# Patient Record
Sex: Female | Born: 1980 | Race: White | Hispanic: No | Marital: Single | Smoking: Current every day smoker
Health system: Southern US, Community
[De-identification: ages and names within clinical notes are randomized; demographics above are authoritative.]

## PROBLEM LIST (undated history)

## (undated) DIAGNOSIS — F1010 Alcohol abuse, uncomplicated: Secondary | ICD-10-CM

## (undated) DIAGNOSIS — F329 Major depressive disorder, single episode, unspecified: Secondary | ICD-10-CM

## (undated) DIAGNOSIS — F419 Anxiety disorder, unspecified: Secondary | ICD-10-CM

## (undated) DIAGNOSIS — F32A Depression, unspecified: Secondary | ICD-10-CM

---

## 2012-05-15 ENCOUNTER — Encounter (HOSPITAL_COMMUNITY): Payer: Self-pay | Admitting: Emergency Medicine

## 2012-05-15 ENCOUNTER — Emergency Department (HOSPITAL_COMMUNITY)
Admission: EM | Admit: 2012-05-15 | Discharge: 2012-05-15 | Disposition: A | Discharge status: Home / Self Care | Payer: Self-pay | Attending: Emergency Medicine | Admitting: Emergency Medicine

## 2012-05-15 DIAGNOSIS — R000 Tachycardia, unspecified: Secondary | ICD-10-CM | POA: Insufficient documentation

## 2012-05-15 DIAGNOSIS — T1490XA Injury, unspecified, initial encounter: Secondary | ICD-10-CM | POA: Insufficient documentation

## 2012-05-15 DIAGNOSIS — F10929 Alcohol use, unspecified with intoxication, unspecified: Secondary | ICD-10-CM

## 2012-05-15 DIAGNOSIS — F1010 Alcohol abuse, uncomplicated: Secondary | ICD-10-CM | POA: Insufficient documentation

## 2012-05-15 DIAGNOSIS — IMO0002 Reserved for concepts with insufficient information to code with codable children: Secondary | ICD-10-CM | POA: Insufficient documentation

## 2012-05-15 LAB — POCT I-STAT, CHEM 8
BUN: 13 mg/dL (ref 6–23)
Calcium, Ion: 1.01 mmol/L — ABNORMAL LOW (ref 1.12–1.32)
Chloride: 107 mEq/L (ref 96–112)
Creatinine, Ser: 1.3 mg/dL — ABNORMAL HIGH (ref 0.50–1.10)
Glucose, Bld: 56 mg/dL — ABNORMAL LOW (ref 70–99)
TCO2: 20 mmol/L (ref 0–100)

## 2012-05-15 LAB — POCT PREGNANCY, URINE: Preg Test, Ur: NEGATIVE

## 2012-05-15 MED ORDER — SODIUM CHLORIDE 0.9 % IV BOLUS (SEPSIS)
1000.0000 mL | Freq: Once | INTRAVENOUS | Status: AC
  Administered 2012-05-15: 1000 mL via INTRAVENOUS
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
## 2012-05-15 NOTE — ED Notes (Signed)
[employer] at bedside to speak with pt.  
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
## 2012-05-19 NOTE — ED Provider Notes (Signed)
  I performed a history and physical examination of Christine Dickson and discussed her management with Dr. Lew Dawes.  I agree with the history, physical, assessment, and plan of care, with the following exceptions: None  I was present for the following procedures: None Time Spent in Critical Care of the patient: None Time spent in discussions with the patient and family: 10  Emmaline Wahba Corlis Leak, MD 05/19/12 [phone]

## 2012-07-15 ENCOUNTER — Emergency Department (HOSPITAL_COMMUNITY)
Admission: EM | Admit: 2012-07-15 | Discharge: 2012-07-16 | Disposition: A | Discharge status: Other Acute Inpatient Hospital | Payer: Self-pay | Attending: Emergency Medicine | Admitting: Emergency Medicine

## 2012-07-15 ENCOUNTER — Encounter (HOSPITAL_COMMUNITY): Payer: Self-pay | Admitting: Adult Health

## 2012-07-15 DIAGNOSIS — F411 Generalized anxiety disorder: Secondary | ICD-10-CM | POA: Insufficient documentation

## 2012-07-15 DIAGNOSIS — F1020 Alcohol dependence, uncomplicated: Secondary | ICD-10-CM | POA: Insufficient documentation

## 2012-07-15 DIAGNOSIS — F10929 Alcohol use, unspecified with intoxication, unspecified: Secondary | ICD-10-CM

## 2012-07-15 DIAGNOSIS — F3289 Other specified depressive episodes: Secondary | ICD-10-CM | POA: Insufficient documentation

## 2012-07-15 DIAGNOSIS — F1010 Alcohol abuse, uncomplicated: Secondary | ICD-10-CM | POA: Insufficient documentation

## 2012-07-15 DIAGNOSIS — F329 Major depressive disorder, single episode, unspecified: Secondary | ICD-10-CM | POA: Insufficient documentation

## 2012-07-15 HISTORY — DX: Major depressive disorder, single episode, unspecified: F32.9

## 2012-07-15 HISTORY — DX: Depression, unspecified: F32.A

## 2012-07-15 LAB — COMPREHENSIVE METABOLIC PANEL
AST: 31 U/L (ref 0–37)
Albumin: 3.9 g/dL (ref 3.5–5.2)
Alkaline Phosphatase: 55 U/L (ref 39–117)
Alkaline Phosphatase: 50 U/L (ref 39–117)
BUN: 13 mg/dL (ref 6–23)
CO2: 24 mEq/L (ref 19–32)
Chloride: 105 mEq/L (ref 96–112)
Creatinine, Ser: 0.68 mg/dL (ref 0.50–1.10)
GFR calc Af Amer: >90 mL/min (ref >90)
Glucose, Bld: 97 mg/dL (ref 70–99)
Potassium: 4.3 mEq/L (ref 3.5–5.1)
Potassium: 4.2 mEq/L (ref 3.5–5.1)
Total Bilirubin: 0.1 mg/dL — ABNORMAL LOW (ref 0.3–1.2)
Total Protein: 7.3 g/dL (ref 6.0–8.3)

## 2012-07-15 LAB — CBC WITH DIFFERENTIAL/PLATELET
Basophils Absolute: 0.2 10*3/uL — ABNORMAL HIGH (ref 0.0–0.1)
Eosinophils Absolute: 0.1 10*3/uL (ref 0.0–0.7)
HCT: 37.7 % (ref 36.0–46.0)
Lymphs Abs: 2.2 10*3/uL (ref 0.7–4.0)
MCH: 33.1 pg (ref 26.0–34.0)
MCHC: 34.7 g/dL (ref 30.0–36.0)
MCV: 95.2 fL (ref 78.0–100.0)
Monocytes Absolute: 0.8 10*3/uL (ref 0.1–1.0)
Neutro Abs: 4.6 10*3/uL (ref 1.7–7.7)
RDW: 12.5 % (ref 11.5–15.5)

## 2012-07-15 LAB — RAPID URINE DRUG SCREEN, HOSP PERFORMED
Barbiturates: NOT DETECTED
Cocaine: NOT DETECTED

## 2012-07-15 LAB — ACETAMINOPHEN LEVEL: Acetaminophen (Tylenol), Serum: <15 ug/mL (ref 10–30)

## 2012-07-15 LAB — POCT PREGNANCY, URINE: Preg Test, Ur: NEGATIVE

## 2012-07-15 LAB — URINALYSIS, ROUTINE W REFLEX MICROSCOPIC
Bilirubin Urine: NEGATIVE
Hgb urine dipstick: NEGATIVE
Protein, ur: NEGATIVE mg/dL
Specific Gravity, Urine: 1.02 (ref 1.005–1.030)
Urobilinogen, UA: 0.2 mg/dL (ref 0.0–1.0)

## 2012-07-15 LAB — CBC
HCT: 39.2 % (ref 36.0–46.0)
Hemoglobin: 13.6 g/dL (ref 12.0–15.0)
MCH: 33.1 pg (ref 26.0–34.0)
MCHC: 34.7 g/dL (ref 30.0–36.0)
MCV: 95.4 fL (ref 78.0–100.0)
RDW: 12.5 % (ref 11.5–15.5)

## 2012-07-15 LAB — URINE MICROSCOPIC-ADD ON

## 2012-07-15 LAB — ETHANOL: Alcohol, Ethyl (B): 459 mg/dL (ref 0–11)

## 2012-07-15 LAB — MAGNESIUM: Magnesium: 2 mg/dL (ref 1.5–2.5)

## 2012-07-15 MED ORDER — FOLIC ACID 1 MG PO TABS
1.0000 mg | ORAL_TABLET | Freq: Every day | ORAL | Status: DC
  Administered 2012-07-15: 1 mg via ORAL
  Filled 2012-07-15: qty 1

## 2012-07-15 MED ORDER — SODIUM CHLORIDE 0.9 % IV BOLUS (SEPSIS)
2000.0000 mL | Freq: Once | INTRAVENOUS | Status: AC
  Administered 2012-07-15: 1000 mL via INTRAVENOUS

## 2012-07-15 MED ORDER — FAMOTIDINE IN NACL 20-0.9 MG/50ML-% IV SOLN
20.0000 mg | Freq: Once | INTRAVENOUS | Status: AC
  Administered 2012-07-15: 20 mg via INTRAVENOUS
  Filled 2012-07-15: qty 50

## 2012-07-15 MED ORDER — VITAMIN B-1 100 MG PO TABS
50.0000 mg | ORAL_TABLET | Freq: Once | ORAL | Status: AC
  Administered 2012-07-15: 50 mg via ORAL
  Filled 2012-07-15: qty 2

## 2012-07-15 MED ORDER — ONDANSETRON 4 MG PO TBDP
4.0000 mg | ORAL_TABLET | Freq: Once | ORAL | Status: AC
  Administered 2012-07-15: 4 mg via ORAL
  Filled 2012-07-15: qty 1

## 2012-07-15 MED ORDER — LORAZEPAM 2 MG/ML IJ SOLN: 1.0000 mg | Freq: Four times a day (QID) | INTRAMUSCULAR | Status: DC | PRN

## 2012-07-15 MED ORDER — LORAZEPAM 1 MG PO TABS
1.0000 mg | ORAL_TABLET | Freq: Four times a day (QID) | ORAL | Status: DC | PRN
  Administered 2012-07-15 – 2012-07-16 (×3): 1 mg via ORAL
  Filled 2012-07-15 (×3): qty 1

## 2012-07-15 MED ORDER — ADULT MULTIVITAMIN W/MINERALS CH
1.0000 | ORAL_TABLET | Freq: Every day | ORAL | Status: DC
  Administered 2012-07-15: 1 via ORAL
  Filled 2012-07-15: qty 1

## 2012-07-15 NOTE — ED Notes (Signed)
Report to Sunita RN 

## 2012-07-15 NOTE — ED Notes (Signed)
IV ,18g removed from rt [facility] as it was tender.

## 2012-07-15 NOTE — ED Provider Notes (Signed)
History     CSN: [id number]  Arrival date & time 07/15/12  0330   First MD Initiated Contact with Patient 07/15/12 [phone]      Chief Complaint  Patient presents with  . Medical Clearance    (Consider location/radiation/quality/duration/timing/severity/associated sxs/prior treatment) HPI  This patient is a 31 year old woman who denies any significant past medical history. She presents with request for treatment of alcoholism. The patient is inconsistent in her history regarding the amount and frequency with which she drinks alcohol. She drinks vodka only. A sounds like she is mainly a binge drinker. She only drinks alone. She has had two DUIs.  She has also lost two jobs in the past 6 months.  Her BF says she is very emotionally labile and frequently angry.   Tonight, she had uncontrollable crying and nausea with heaving after her BF returned home from work around 2300 last night. Patient has entered tx for alcoholism once before - in 2001 - but signed herself out.  She denies history of DTs or withdrawal seizure. She says her last drink was before midnight and that she had "a few glasses".   She is not taking any meds and does not see a PCP nor psychiatrist. Denies SI and HI.  BF has no concerns that the patient is suicidal or psychotic. Patient denies abdominal pain.   Past Medical History  Diagnosis Date  . Depression     History reviewed. No pertinent past surgical history.  History reviewed. No pertinent family history.  History  Substance Use Topics  . Smoking status: Current Everyday Smoker  . Smokeless tobacco: Not on file  . Alcohol Use: Yes    OB History    Grav Para Term Preterm Abortions TAB SAB Ect Mult Living                  Review of Systems    It should be noted that ROS was obtained while the patient was intoxicated.   Gen: no weight loss, fevers, chills, night sweats Eyes: no discharge or drainage, no occular pain or visual changes Nose: no epistaxis  or rhinorrhea Mouth: no dental pain, no sore throat Neck: no neck pain Lungs: no SOB, cough, wheezing CV: no chest pain, palpitations, dependent edema or orthopnea Abd: as per hpi, otherwise negative GU: no dysuria or gross hematuria MSK: no myalgias or arthralgias Neuro: no headache, no focal neurologic deficits Skin: no rash Psyche: depression, alcohol abuse  Allergies  Review of patient's allergies indicates no known allergies.  Home Medications  No current outpatient prescriptions on file.  BP 126/84  Pulse 99  Temp 98.9 F (37.2 C)  Resp 18  SpO2 94%  Physical Exam  Gen: well developed and well nourished appearing, appears intoxicated, intermittently tearful, alert and oriented x 4.  Head: NCAT Eyes: PERL, EOMI, conjunctiva mildly injected bilaterally. Nose: no epistaixis or rhinorrhea Mouth/throat: mucosa is moist and pink Neck: supple, no stridor Lungs: CTA B, no wheezing, rhonchi or rales CV: RRR, no murmur, extremities appear well perfused Abd: soft, notender, nondistended Back: no ttp, no cva ttp Skin: no rash Neuro: CN ii-xii grossly intact, mildly slurred speech, no motor deficits.  Psyche; tearful affect with anxiety, limited insight, appears intoxicated, cooperative  ED Course  Procedures (including critical care time)  Labs Reviewed  COMPREHENSIVE METABOLIC PANEL - Abnormal; Notable for the following:    Total Bilirubin 0.1 (*)     All other components within normal limits  ETHANOL - Abnormal;  Notable for the following:    Alcohol, Ethyl (B) 459 (*)     All other components within normal limits  COMPREHENSIVE METABOLIC PANEL - Abnormal; Notable for the following:    Total Bilirubin 0.1 (*)     All other components within normal limits  CBC  ACETAMINOPHEN LEVEL  URINE RAPID DRUG SCREEN (HOSP PERFORMED)  POCT PREGNANCY, URINE  LIPASE, BLOOD  CBC WITH DIFFERENTIAL  URINALYSIS, ROUTINE W REFLEX MICROSCOPIC  PREGNANCY, URINE    ED COURSE AND  MDM:  Patient with acute alcohol intoxication - BAL 450 mg/dL - and chronic alcoholism. She is requesting inpatient tx for alcoholism and I think this would be a good step for her. Her BAL will need to clear to around 200 mg/dL before she may be adequately assessed by our mental health team. In the meantime, we have treated with thiamine and will give oral folate and MTV. Mg level is pending. We will begin alcohol withdrawal protocol. Tx with IVF.    MDM          Brandt Loosen, MD 07/15/12 [phone]
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
## 2012-07-15 NOTE — ED Notes (Addendum)
Family member reports psychological stress, anxiety, alcohol intoxication and uncontrollable crying. Pt is tearful and very anxious and complains of lower right quandrant pain, vomiting, and diarrhea. Pt recently had a DUI and has been "unrecognizable" ever since.  Smells of ETOH, denies other substance abuse, denies SI and HI

## 2012-07-15 NOTE — ED Notes (Signed)
Act Team member notified of patient being placed in Pod C and evaluation would be needed.

## 2012-07-15 NOTE — ED Notes (Addendum)
Per boyfriend. Came home this morning and she was acting irrationally. Complaining of abdominal pain localized to right side.  When asked to show me where it hurts pt touchs right hip, and right flank.  Witness vomiting. Was unable to get pt off the floor so he called EMS. Pt has a history of depression and ETOH abuse.  Reports temps over 100 F over the past few days.  Pt. Unable to state what she drank.

## 2012-07-16 ENCOUNTER — Inpatient Hospital Stay (HOSPITAL_COMMUNITY)
Admission: AD | Admit: 2012-07-16 | Discharge: 2012-07-18 | DRG: 897 | Disposition: A | Discharge status: Home / Self Care | Payer: No Typology Code available for payment source | Attending: Psychiatry | Admitting: Psychiatry

## 2012-07-16 ENCOUNTER — Encounter (HOSPITAL_COMMUNITY): Payer: Self-pay | Admitting: Emergency Medicine

## 2012-07-16 DIAGNOSIS — F10939 Alcohol use, unspecified with withdrawal, unspecified: Principal | ICD-10-CM | POA: Diagnosis present

## 2012-07-16 DIAGNOSIS — F1020 Alcohol dependence, uncomplicated: Secondary | ICD-10-CM | POA: Diagnosis present

## 2012-07-16 DIAGNOSIS — F1994 Other psychoactive substance use, unspecified with psychoactive substance-induced mood disorder: Secondary | ICD-10-CM | POA: Diagnosis present

## 2012-07-16 DIAGNOSIS — F10239 Alcohol dependence with withdrawal, unspecified: Principal | ICD-10-CM | POA: Diagnosis present

## 2012-07-16 MED ORDER — MAGNESIUM HYDROXIDE 400 MG/5ML PO SUSP: 30.0000 mL | Freq: Every day | ORAL | Status: DC | PRN

## 2012-07-16 MED ORDER — CHLORDIAZEPOXIDE HCL 25 MG PO CAPS: 25.0000 mg | ORAL_CAPSULE | Freq: Four times a day (QID) | ORAL | Status: DC | PRN

## 2012-07-16 MED ORDER — CHLORDIAZEPOXIDE HCL 25 MG PO CAPS
25.0000 mg | ORAL_CAPSULE | Freq: Three times a day (TID) | ORAL | Status: AC
  Administered 2012-07-17 (×3): 25 mg via ORAL
  Filled 2012-07-16 (×3): qty 1

## 2012-07-16 MED ORDER — VITAMIN B-1 100 MG PO TABS
100.0000 mg | ORAL_TABLET | Freq: Every day | ORAL | Status: DC
  Filled 2012-07-16 (×2): qty 1

## 2012-07-16 MED ORDER — ACETAMINOPHEN 325 MG PO TABS
650.0000 mg | ORAL_TABLET | Freq: Four times a day (QID) | ORAL | Status: DC | PRN
  Administered 2012-07-16 – 2012-07-18 (×2): 650 mg via ORAL

## 2012-07-16 MED ORDER — LOPERAMIDE HCL 2 MG PO CAPS: 2.0000 mg | ORAL_CAPSULE | ORAL | Status: DC | PRN

## 2012-07-16 MED ORDER — ONDANSETRON 4 MG PO TBDP: 4.0000 mg | ORAL_TABLET | Freq: Four times a day (QID) | ORAL | Status: DC | PRN

## 2012-07-16 MED ORDER — THIAMINE HCL 100 MG/ML IJ SOLN: 100.0000 mg | Freq: Once | INTRAMUSCULAR | Status: DC

## 2012-07-16 MED ORDER — HYDROXYZINE HCL 25 MG PO TABS: 25.0000 mg | ORAL_TABLET | Freq: Four times a day (QID) | ORAL | Status: DC | PRN

## 2012-07-16 MED ORDER — ALUM & MAG HYDROXIDE-SIMETH 200-200-20 MG/5ML PO SUSP: 30.0000 mL | ORAL | Status: DC | PRN

## 2012-07-16 MED ORDER — SULFAMETHOXAZOLE-TMP DS 800-160 MG PO TABS
1.0000 | ORAL_TABLET | Freq: Two times a day (BID) | ORAL | Status: DC
  Administered 2012-07-16 – 2012-07-18 (×5): 1 via ORAL
  Filled 2012-07-16: qty 11
  Filled 2012-07-16 (×8): qty 1
  Filled 2012-07-16: qty 11

## 2012-07-16 MED ORDER — VITAMIN B-1 100 MG PO TABS
100.0000 mg | ORAL_TABLET | Freq: Every day | ORAL | Status: DC
  Administered 2012-07-17 – 2012-07-18 (×2): 100 mg via ORAL
  Filled 2012-07-16 (×4): qty 1

## 2012-07-16 MED ORDER — CHLORDIAZEPOXIDE HCL 25 MG PO CAPS
25.0000 mg | ORAL_CAPSULE | ORAL | Status: DC
  Administered 2012-07-18: 25 mg via ORAL
  Filled 2012-07-16: qty 1

## 2012-07-16 MED ORDER — ADULT MULTIVITAMIN W/MINERALS CH
1.0000 | ORAL_TABLET | Freq: Every day | ORAL | Status: DC
  Filled 2012-07-16 (×3): qty 1

## 2012-07-16 MED ORDER — NICOTINE 14 MG/24HR TD PT24
14.0000 mg | MEDICATED_PATCH | Freq: Every day | TRANSDERMAL | Status: DC
  Administered 2012-07-16 – 2012-07-18 (×3): 14 mg via TRANSDERMAL
  Filled 2012-07-16 (×6): qty 1

## 2012-07-16 MED ORDER — CHLORDIAZEPOXIDE HCL 25 MG PO CAPS
25.0000 mg | ORAL_CAPSULE | Freq: Four times a day (QID) | ORAL | Status: AC
  Administered 2012-07-16 (×3): 25 mg via ORAL
  Filled 2012-07-16 (×3): qty 1

## 2012-07-16 MED ORDER — ADULT MULTIVITAMIN W/MINERALS CH
1.0000 | ORAL_TABLET | Freq: Every day | ORAL | Status: DC
Start: 2012-07-16 — End: 2012-07-18
  Administered 2012-07-16 – 2012-07-18 (×3): 1 via ORAL
  Filled 2012-07-16 (×5): qty 1

## 2012-07-16 MED ORDER — CITALOPRAM HYDROBROMIDE 20 MG PO TABS
20.0000 mg | ORAL_TABLET | Freq: Every day | ORAL | Status: DC
  Administered 2012-07-16 – 2012-07-18 (×3): 20 mg via ORAL
  Filled 2012-07-16 (×4): qty 1
  Filled 2012-07-16: qty 14
  Filled 2012-07-16: qty 1

## 2012-07-16 MED ORDER — THIAMINE HCL 100 MG/ML IJ SOLN
100.0000 mg | Freq: Once | INTRAMUSCULAR | Status: AC
  Administered 2012-07-16: 100 mg via INTRAMUSCULAR

## 2012-07-16 MED ORDER — TRAZODONE HCL 100 MG PO TABS
100.0000 mg | ORAL_TABLET | Freq: Every evening | ORAL | Status: DC | PRN
  Administered 2012-07-16 – 2012-07-17 (×2): 100 mg via ORAL
  Filled 2012-07-16 (×2): qty 1
  Filled 2012-07-16: qty 14

## 2012-07-16 MED ORDER — CHLORDIAZEPOXIDE HCL 25 MG PO CAPS: 25.0000 mg | ORAL_CAPSULE | Freq: Every day | ORAL | Status: DC

## 2012-07-16 MED ORDER — PANTOPRAZOLE SODIUM 20 MG PO TBEC
20.0000 mg | DELAYED_RELEASE_TABLET | Freq: Two times a day (BID) | ORAL | Status: DC
  Administered 2012-07-16 – 2012-07-18 (×5): 20 mg via ORAL
  Filled 2012-07-16 (×3): qty 1
  Filled 2012-07-16: qty 28
  Filled 2012-07-16 (×2): qty 1
  Filled 2012-07-16: qty 28
  Filled 2012-07-16 (×4): qty 1

## 2012-07-16 MED ORDER — CHLORDIAZEPOXIDE HCL 25 MG PO CAPS
50.0000 mg | ORAL_CAPSULE | Freq: Once | ORAL | Status: AC
  Administered 2012-07-16: 50 mg via ORAL
  Filled 2012-07-16: qty 2

## 2012-07-16 MED ORDER — CITALOPRAM HYDROBROMIDE 10 MG PO TABS
10.0000 mg | ORAL_TABLET | Freq: Every day | ORAL | Status: DC
  Filled 2012-07-16 (×2): qty 1

## 2012-07-16 MED ORDER — NICOTINE 14 MG/24HR TD PT24
MEDICATED_PATCH | TRANSDERMAL | Status: AC
  Filled 2012-07-16: qty 1

## 2012-07-16 NOTE — BHH Counselor (Signed)
Christine Dickson, ACT counselor at [facility], submitted Pt for admission to [facility]. Consulted with Binnie Rail, [facility] who confirmed bed space. Gave clinical report to Verne Spurr, PA who accepted Pt to the service of Dr. Thomasene Lot, room 304-2.  Harlin Rain Patsy Baltimore, LPC

## 2012-07-16 NOTE — Progress Notes (Signed)
D.  Pt pleasant and bright on approach.  No s/s of withdrawal present at this time.  Denies SI/HI/hallucinations at this time.  Positive for evening AA group.  Denies complaints other than difficulty sleeping.  Interacting  Appropriately within milieu.  A.  Support and encouragement given, will give medication for insomnia at HS   R.  Pt in dayroom interacting with peers, will continue to monitor.

## 2012-07-16 NOTE — Progress Notes (Signed)
Patient ID: Christine Dickson, female   DOB: 07-22-1981, 31 y.o.   MRN: [id number] She has been up and to meal and to get her medication. She has been pleasant and cooperative. Self inventory Depression 4, hopeless 3, . W/D of chilling stomach pain and problem sleeping. Cwia is 2 at this time.

## 2012-07-16 NOTE — ED Notes (Signed)
Pt excepted in [facility].

## 2012-07-16 NOTE — Progress Notes (Signed)
Patient ID: Christine Dickson, female   DOB: 12-Nov-1981, 31 y.o.   MRN: [id number]  Pt admitted Voluntarily for Alcohol detox. Pt states she drank 1 pint of liquor on Friday night. Pt states she averages drinking two nights per week. Pt is not employed and lives in a private residence with her boyfriend. Pt denies SI or plans to hurt herself at this time. Pt oriented to unit rules and regulations.

## 2012-07-16 NOTE — BHH Suicide Risk Assessment (Signed)
Suicide Risk Assessment  Admission Assessment     Demographic factors:  Assessment Details Time of Assessment: Admission Information Obtained From: Patient Current Mental Status:    Loss Factors:    Historical Factors:    Risk Reduction Factors:  Risk Reduction Factors: Sense of responsibility to family;Positive social support  CLINICAL FACTORS:   Severe Anxiety and/or Agitation Depression:   Anhedonia Comorbid alcohol abuse/dependence Insomnia Alcohol/Substance Abuse/Dependencies More than one psychiatric diagnosis Previous Psychiatric Diagnoses and Treatments  COGNITIVE FEATURES THAT CONTRIBUTE TO RISK:  None Noted.   Current Mental Status Per Physician:   Diagnosis: Axis I:  Alcohol Dependence.  Substance Induced Mood Disorder.  The patient was seen today and reports the following:   ADL's: Intact.  Sleep: The patient reports to having difficulty initiating and maintaining sleep.  Appetite: The patient reports a good appetite today.   Mild>(1-10) >Severe  Hopelessness (1-10): 0  Depression (1-10): 3  Anxiety (1-10): 6   Suicidal Ideation: The patient denies any suicidal ideations today.  Plan: No  Intent: No  Means: No   Homicidal Ideation: The patient denies any homicidal ideations today.  Plan: No  Intent: No.  Means: No   General Appearance/Behavior: The patient was cooperative today with this provider but appeared moderately depressed.  Eye Contact: Good.  Speech: Appropriate in rate and volume with no pressuring of speech noted today.  Motor Behavior: wnl.  Level of Consciousness: Alert and Oriented x 3.  Mental Status: Alert and Oriented x 3.  Mood:  Moderately Depressed.  Affect: Moderately Constricted.  Anxiety Level: Moderate anxiety reported today.  Thought Process: wnl.  Thought Content: The patient denies any auditory or visual hallucinations or delusional thinking.  Perception: wnl.  Judgment: Fair to Good.  Insight: Fair to Good.    Cognition: Oriented to person, place and time.   Current Medications:  . chlordiazePOXIDE  25 mg Oral QID   Followed by  . chlordiazePOXIDE  25 mg Oral TID   Followed by  . chlordiazePOXIDE  25 mg Oral BH-qamhs   Followed by  . chlordiazePOXIDE  25 mg Oral Daily  . citalopram  20 mg Oral Daily  . multivitamin with minerals  1 tablet Oral Daily  . pantoprazole  20 mg Oral BID AC  . sulfamethoxazole-trimethoprim  1 tablet Oral BID WC  . thiamine  100 mg Intramuscular Once  . thiamine  100 mg Intramuscular Once  . thiamine  100 mg Oral Daily   Review of Systems:  Neurological: No headaches, seizures or dizziness reported.  G.I.: The patient denies any constipation or stomach upset today.  Musculoskeletal: The patient denies any musculoskeletal issues today.   Time was spent today discussing with the patient her current symptoms. The patient states that she is having significant difficulty initiating and maintaining sleep but reports a good appetite. She reports moderate feelings of sadness, anhedonia and depressed mood and denies any suicidal or homicidal ideations. The patient also denies any auditory or visual hallucinations or delusional thinking but reports moderate anxiety symptoms today.   The patient states that she has been using alcohol for some time and had a blood alcohol level at admission of 459.  She also reports depression and irritability and presents today for alcohol detox and for treatment of her depressive and anxiety symptoms.  Treatment Plan Summary:  1. Daily contact with patient to assess and evaluate symptoms and progress in treatment.  2. Medication management  3. The patient will deny suicidal ideations or  homicidal ideations for 48 hours prior to discharge and have a depression and anxiety rating of 3 or less. The patient will also deny any auditory or visual hallucinations or delusional thinking.  4. The patient will deny any symptoms of substance withdrawal  at time of discharge.   Plan:  1. Will start the patient on the medication Celexa at 20 mgs po q am for depression and anxiety.  2. Will start the patient on the Librium Protocol for detox from her addiction to alcohol.  3. Will start the patient on the medication Bactrim DS po BID-WC x 7 days for a UTI. 4. Will start the patient in the medication Trazodone at 100 mgs po qhs - prn for sleep. 5. Laboratory Studies reviewed.  6. Will continue to monitor.   SUICIDE RISK:  Minimal: No identifiable suicidal ideation.  Patients presenting with no risk factors but with morbid ruminations; may be classified as minimal risk based on the severity of the depressive symptoms  Christine Dickson 07/16/2012, 1:39 PM

## 2012-07-16 NOTE — Tx Team (Signed)
Initial Interdisciplinary Treatment Plan  PATIENT STRENGTHS: (choose at least two) Ability for insight Average or above average intelligence Capable of independent living Communication skills General fund of knowledge Motivation for treatment/growth Physical Health Supportive family/friends  PATIENT STRESSORS: Substance abuse   PROBLEM LIST: Problem List/Patient Goals Date to be addressed Date deferred Reason deferred Estimated date of resolution  Alcohol Abuse 07/16/12     Depression 07/16/12                                                DISCHARGE CRITERIA:  Ability to meet basic life and health needs Improved stabilization in mood, thinking, and/or behavior Motivation to continue treatment in a less acute level of care Verbal commitment to aftercare and medication compliance  PRELIMINARY DISCHARGE PLAN: Outpatient therapy Return to previous living arrangement  PATIENT/FAMIILY INVOLVEMENT: This treatment plan has been presented to and reviewed with the patient, Christine Dickson, and/or family member, .  The patient and family have been given the opportunity to ask questions and make suggestions.  Christine Dickson 07/16/2012, 5:08 AM

## 2012-07-16 NOTE — Progress Notes (Signed)
BHH Group Notes:  (Counselor/Nursing/MHT/Case Management/Adjunct)  07/16/2012 1:15 PM  Type of Therapy:  Group Therapy, Dance/Movement Therapy   Participation Level:  Active  Participation Quality:  Appropriate, Sharing and Supportive  Affect:  Appropriate  Cognitive:  Appropriate  Insight:  Limited  Engagement in Group:  Good  Engagement in Therapy:  Limited  Modes of Intervention:  Clarification, Problem-solving, Role-play, Socialization and Support  Summary of Progress/Problems: Group discussed how to support your self and how to be selfish in a postive way, being selfish wanting your recovery. Group shared how to use emopwering personal statements to stay positive and focused. Pt shared that her grandfather was her main support for he taught her many life lessons. Pt shared a phrase from her grandfather about doing everything to the fullest and finishing what you started.

## 2012-07-16 NOTE — H&P (Signed)
Psychiatric Admission Assessment Adult  Patient Identification:  Christine Dickson Date of Evaluation:  07/16/2012 31yo SWF CC:  Alcohol detox ETOH 459  History of Present Illness: BF brought to [facility] for help. After he returned home from work around 11 pm patient was crying uncontrollably heaving and c/o nausea. She is frequently angry and can be labile.  She is a binge drinker Vodka only. 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 Denies withdrawal seizures or DT's. CIWA 7.   Past Psychiatric History: Entered treatment for alcoholism 2001 but signed herself out.   Substance Abuse History: Began using alcohol age 31. Usually consumes a pint of Vodka 2-3 times a week.   Social History:    reports that she has been smoking.  She does not have any smokeless tobacco history on file. She reports that she drinks alcohol. She reports that she does not use illicit drugs. Some college never married no children. Today is one year anniversary with this BF. 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 First DUI 8 years ago in [facility].   Family Psych History: Maternal grandfather alcohol.   Past Medical History:     Past Medical History  Diagnosis Date  . Depression       No past surgical history on file.  Allergies: No Known Allergies  Current Medications:  Prior to Admission medications   Not on File    Mental Status Examination/Evaluation: Objective:  Appearance: Casual  Psychomotor Activity:  Normal and Tremor is minimal   Eye Contact:  Good  Speech:  Clear and Coherent and Normal Rate  Volume:  Normal  Mood:  Easily angered   Affect:  Depressed  Thought Process:  Clear rational goal oriented   Orientation:  Full  Thought Content:  No AVH/psychosis   Suicidal Thoughts:  No  Homicidal Thoughts:  No  Judgement:  Fair  Insight:  Fair    DIAGNOSIS:    AXIS I Alcohol Abuse, Substance Abuse and Substance Induced Mood Disorder  AXIS II  Deferred  AXIS III See medical history.  AXIS IV economic problems, occupational problems and other psychosocial or environmental problems  AXIS V 41-50 serious symptoms     Treatment Plan Summary: Admit for safety & stabilization  Medically support through alcohol detox using the Librium protocol.  Start Celexa for lack of motivation and anger

## 2012-07-16 NOTE — Progress Notes (Signed)
Patient did attend the evening speaker AA meeting.  

## 2012-07-17 NOTE — [facility] Counselor (Signed)
Adult Comprehensive Assessment  Patient ID: Christine Dickson, female   DOB: May 03, 1981, 31 y.o.   MRN: [id number]  Information Source: Information source: Patient  Current Stressors:  Educational / Learning stressors: no stressors reported Employment / Job issues: lost job a month or so ago Family Relationships: no stressors reported Surveyor, quantity / Lack of resources (include bankruptcy): no income or insurance, just began receiving unemployment Housing / Lack of housing: no stressors reported Physical health (include injuries & life threatening diseases): no stressors reported Social relationships: no stressors reported Substance abuse: alcohol abuse (binge) Bereavement / Loss: loss of job, loss of sense of self  Living/Environment/Situation:  Living Arrangements: Spouse/significant other Living conditions (as described by patient or guardian): lives with boyfriend How long has patient lived in current situation?: 1 year What is atmosphere in current home: Supportive;Loving  Family History:  Marital status: Long term relationship Long term relationship, how long?: 1 year  What types of issues is patient dealing with in the relationship?: bf is very supportive and is worried about her drinking Does patient have children?: No  Childhood History:  By whom was/is the patient raised?: Both parents Additional childhood history information: "normal, stable childhood" Description of patient's relationship with caregiver when they were a child: good with mother and father Patient's description of current relationship with people who raised him/her: good with mother and father Does patient have siblings?: Yes Number of Siblings: 2  Description of patient's current relationship with siblings: sisters, she is close to both of them Did patient suffer any verbal/emotional/physical/sexual abuse as a child?: No Did patient suffer from severe childhood neglect?: No Has patient ever been sexually  abused/assaulted/raped as an adolescent or adult?: No Was the patient ever a victim of a crime or a disaster?: No Witnessed domestic violence?: No Has patient been effected by domestic violence as an adult?: No  Education:  Highest grade of school patient has completed: some college Currently a Consulting civil engineer?: No Learning disability?: No  Employment/Work Situation:   Employment situation: Unemployed (hasn't worked in a month) Patient's job has been impacted by current illness: Yes Describe how patient's job has been implacted: last job was lost due to depresion and stress level What is the longest time patient has a held a job?: 7 years 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 Has patient ever been in the [employer]?: No Has patient ever served in combat?: No  Financial Resources:   Financial resources: Actor unemployment Does patient have a Lawyer or guardian?: No  Alcohol/Substance Abuse:   What has been your use of drugs/alcohol within the last 12 months?: has begun using alcohol lately, a couple of times a week and drinking a lot (maybe a pint?) when she does drink; has been using the alcohol to reduce depression and anxiety If attempted suicide, did drugs/alcohol play a role in this?: No Alcohol/Substance Abuse Treatment Hx: Denies past history Has alcohol/substance abuse ever caused legal problems?: Yes (DUI at the beginning of July)  Social Support System:   Patient's Community Support System: Production assistant, radio System: boyfriend, parents, sisters Type of faith/religion: Catholic How does patient's faith help to cope with current illness?: prayer, talking to God  Leisure/Recreation:   Leisure and Hobbies: playing tennis, swimming, reading, crossword puzzles  Strengths/Needs:   What things does the patient do well?: good listener, people person, likes  being around people In what areas does patient struggle / problems for  patient: had an extreme amount to drink and  bf brought him in, unemployed with no insurance, some depression, DUI in July, legal stuff, bottles things in instead of talking  Discharge Plan:   Does patient have access to transportation?: Yes Will patient be returning to same living situation after discharge?: Yes Currently receiving community mental health services: No If no, would patient like referral for services when discharged?: Yes (What county?) Medical sales representative) Does patient have financial barriers related to discharge medications?: Yes Patient description of barriers related to discharge medications: has no insurance  Summary/Recommendations:   Summary and Recommendations (to be completed by the evaluator): Christine Dickson is a 31 year old single female diagnosed with Alcohol Dependence. She seems to minimize the severity of her drinking problem, stating that she does not drink often but drinks a lot when she does drink. According to assessment in chart she is drinking 2-3 times a week. Boyfriend is supportive and wants her to get sober. She denies any trauma or major stressors, but does state she is unemployed. Also reports she got a DUI in July and worries about that legal matter. States she drinks when she is feeling stresed about these things. Christine Dickson would benefit from crisi sstabilization, medication evauation , therapy groups for processing thoughts/feelings/experiences, psychoed  groups for coping skills and case management for discharge planning.   Lyn Hollingshead, Lyndee Hensen. 07/17/2012

## 2012-07-17 NOTE — Progress Notes (Signed)
[facility] MD Progress Note  07/17/2012 3:30 PM  S/O: Patient seen and evaluated. Chart reviewed. Patient stated that her mood was "good". Her affect was mood congruent and euthymic. She denied any current thoughts of self injurious behavior, suicidal ideation or homicidal ideation. There were no auditory or visual hallucinations, paranoia, delusional thought processes, or mania noted.  Thought process was linear and goal directed.  No psychomotor agitation or retardation was noted. Speech was normal rate, tone and volume. Eye contact was good. Judgment and insight are fair.  Patient has been up and engaged on the unit.  No acute safety concerns reported from team.  No sig withdrawal s/s noted at this time with use of standard librium taper. Stressors include DUI and unemployment.   Sleep:  Number of Hours: 4.5    Vital Signs:Blood pressure 128/91, pulse 92, temperature 98.1 F (36.7 C), temperature source Oral, resp. rate 16, height 5\' 8"  (1.727 m), weight 54.885 kg (121 lb).  Current Medications:    . chlordiazePOXIDE  25 mg Oral QID   Followed by  . chlordiazePOXIDE  25 mg Oral TID   Followed by  . chlordiazePOXIDE  25 mg Oral BH-qamhs   Followed by  . chlordiazePOXIDE  25 mg Oral Daily  . citalopram  20 mg Oral Daily  . multivitamin with minerals  1 tablet Oral Daily  . nicotine      . nicotine  14 mg Transdermal Daily  . pantoprazole  20 mg Oral BID AC  . sulfamethoxazole-trimethoprim  1 tablet Oral BID WC  . thiamine  100 mg Oral Daily    Lab Results: No results found for this or any previous visit (from the past 48 hour(s)).  Physical Findings: AIMS: Facial and Oral Movements Muscles of Facial Expression: None, normal Lips and Perioral Area: None, normal Jaw: None, normal Tongue: None, normal,Extremity Movements Upper (arms, wrists, hands, fingers): None, normal Lower (legs, knees, ankles, toes): None, normal, Trunk Movements Neck, shoulders, hips: None, normal, Overall  Severity Severity of abnormal movements (highest score from questions above): None, normal Incapacitation due to abnormal movements: None, normal Patient's awareness of abnormal movements (rate only patient's report): No Awareness, Dental Status Current problems with teeth and/or dentures?: No Does patient usually wear dentures?: No  CIWA:  CIWA-Ar Total: 0  COWS:  COWS Total Score: 1   Plan: Alcohol Use & W/D Disorders; SIMD, resolving  Pt seen and evaluated in treatment team.  Reviewed short term and long term goals, medications, current treatment in the hospital and acute/chronic safety.  Pt denied any current thoughts of self harm, suicidal ideation or homicidal ideation.  Contracted for safety on the unit.  No acute issues noted.  VS reviewed with team.  Pt agreeable with treatment plan, see orders. Continue current medications as noted above.  Lupe Carney 07/17/2012, 3:30 PM

## 2012-07-17 NOTE — Discharge Planning (Signed)
Christine Dickson attended AM group, good participation.  States she drank way too much alcohol on Friday night, and her boyfriend suggested she go somewhere for help.  Got a DUI several months ago, and is recently unemployed as stressors.  Plans to return home, follow up outpt.

## 2012-07-17 NOTE — Treatment Plan (Signed)
Interdisciplinary Treatment Plan Update (Adult)  Date: 07/17/2012  Time Reviewed: 11:15 AM   Progress in Treatment: Attending groups: Yes Participating in groups: Yes Taking medication as prescribed: Yes Tolerating medication: Yes   Family/Significant other contact made:  Yes Patient understands diagnosis:  Yes  As evidenced by asking for help with detox from alcohol, getting on medications for depression and anxiety Discussing patient identified problems/goals with staff:  Yes  See below Medical problems stabilized or resolved:  Yes Denies suicidal/homicidal ideation: Yes  In tx team Issues/concerns per patient self-inventory:  Yes  None noted Other:  New problem(s) identified: N/A  Reason for Continuation of Hospitalization: Depression Medication stabilization Withdrawal symptoms  Interventions implemented related to continuation of hospitalization:  Celexa trial,  Encourage group attendance and participation  Additional comments:  Estimated length of stay:1-2 days  Discharge Plan: See below  New goal(s): N/A  Review of initial/current patient goals per problem list:   1.  Goal(s): Safely detox from alcohol  Met:  No  Target date:9/3  As evidenced [id number] vitals, no withdrawal symptoms  2.  Goal (s): Stabilize mood  Met:  No  Target date:9/3  As evidenced [id number] will report her depression and anxiety are manageable  3.  Goal(s): Identify comprehensive sobriety plan  Met:  Yes  Target date: 9/2  As evidenced [id number] reports she will follow up with outpt psychiatry and IOP groups  4.  Goal(s):  Met:  No  Target date:  As evidenced by:  Attendees: Patient:  Christine Dickson 07/17/2012 11:15 AM  Family:     Physician:  Lupe Carney 07/17/2012 11:15 AM   Nursing:    07/17/2012 11:15 AM   Case Manager:  Richelle Ito, LCSW 07/17/2012 11:15 AM   Counselor:  Ronda Fairly, LCSWA 07/17/2012 11:15 AM   Other:     Other:     Other:     Other:       Scribe for Treatment Team:   Ida Rogue, 07/17/2012 11:15 AM

## 2012-07-17 NOTE — Progress Notes (Signed)
Psychoeducational Group Note  Date:  07/17/2012 Time:  1000  Group Topic/Focus:  Therapeutic Activity- Question Ball Participation Level:  Active  Participation Quality:  Appropriate, Attentive and Sharing  Affect:  Appropriate  Cognitive:  Appropriate  Insight:  Good  Engagement in Group:  Good  Additional Comments:  Pt was very appropriate and attentive while attending group. Pt was willing to answer questions from question ball and remained pleasant for the whole group.  Sharyn Lull 07/17/2012, 11:36 AM

## 2012-07-17 NOTE — Progress Notes (Signed)
D-Patient is out on unit interacting with peers and attending groups. A-Rates depression at 1 and hopelessness at 1. Denies SI.  Reports good sleep,appetite and energy level. Motivated for treatment and proactive with d/c plans. R- Support and encouragement given. Continue current POC and evaluation of treatment goals. Continue 15' checks for safety.

## 2012-07-17 NOTE — Progress Notes (Signed)
Psychoeducational Group Note  Date:  07/17/2012 Time: 1100  Group Topic/Focus:  Self Care:   The focus of this group is to help patients understand the importance of self-care in order to improve or restore emotional, physical, spiritual, interpersonal, and financial health.  Participation Level:  Active  Participation Quality:  Appropriate, Attentive and Sharing  Affect:  Appropriate  Cognitive:  Appropriate  Insight:  Good  Engagement in Group:  Good  Additional Comments: Pt participated in self care group. Pt defined self-care, and completed self-care assessment. Pt identified strengths in areas of psychological, physiological, emotional, spiritual of taking care of self. Pt also identified areas where improvement would be needed and making a specific goal to meeting needs of self-care improvement.     Karleen Hampshire Brittini 07/17/2012, 1:12 PM

## 2012-07-18 MED ORDER — CITALOPRAM HYDROBROMIDE 20 MG PO TABS: 20.0000 mg | ORAL_TABLET | Freq: Every day | ORAL | Status: DC

## 2012-07-18 MED ORDER — TRAZODONE HCL 100 MG PO TABS: 100.0000 mg | ORAL_TABLET | Freq: Every evening | ORAL | Status: DC | PRN

## 2012-07-18 MED ORDER — PANTOPRAZOLE SODIUM 20 MG PO TBEC: 20.0000 mg | DELAYED_RELEASE_TABLET | Freq: Two times a day (BID) | ORAL | Status: DC

## 2012-07-18 MED ORDER — SULFAMETHOXAZOLE-TMP DS 800-160 MG PO TABS: 1.0000 | ORAL_TABLET | Freq: Two times a day (BID) | ORAL | Status: AC

## 2012-07-18 NOTE — Progress Notes (Signed)
Psychoeducational Group Note  Date:  07/18/2012 Time: 1000  Group Topic/Focus:  Recovery Goals:   The focus of this group is to identify appropriate goals for recovery and establish a plan to achieve them.  Participation Level:  Active  Participation Quality:  Appropriate, Attentive, Sharing and Supportive  Affect:  Appropriate  Cognitive:  Appropriate  Insight:  Good  Engagement in Group:  Good  Additional Comments:  Pt participated in recovery goals group. Pt defined recovery and what it would look like in his life. Pt was given a worksheet on my personal goals for recovery, and wrote down two changes that would be made in order to progress in recovery and what could be done differently that would promote healthy behaviors. Pt stated she wanted to be able to change the people she around to stay sober, and find more things to do in spare time that positive such as exercising.    Karleen Hampshire Brittini 07/18/2012, 3:11 PM

## 2012-07-18 NOTE — [facility] Suicide Risk Assessment (Signed)
Suicide Risk Assessment  Discharge Assessment     Demographic Factors: See chart.  Current Mental Status by Physician: Patient seen and evaluated. Chart reviewed. Patient stated that her mood was "good". Slept "very well" last night. Her affect was mood congruent and euthymic. She denied any current thoughts of self injurious behavior, suicidal ideation or homicidal ideation. There were no auditory or visual hallucinations, paranoia, delusional thought processes, or mania noted.  Thought process was linear and goal directed.  No psychomotor agitation or retardation was noted. Speech was normal rate, tone and volume. Eye contact was good. Judgment and insight are fair.  Patient has been up and engaged on the unit.  No acute safety concerns reported from team.  No sig withdrawal s/s noted at this time.    Loss Factors: none reported.  Historical Factors: denied hx SI/SIB/attempts; no FamHx suicide  Risk Reduction Factors: Sense of responsibility to family;Positive social support; lives with BF; views meds as helpful  Discharge Diagnoses: Alcohol Use Disorder; SIMD, resolving   Past Medical History  Diagnosis Date  . Depression    Cognitive Features That Contribute To Risk: none.  Suicide Risk: Patient is currently viewed as a low risk of harm to herself and others in light of her history and risk factors. There are no acute safety concerns and she is stable for discharge. Continued sobriety, medication management and followup will mitigate against any potential increased risk in the future.   Plan Of Care/Follow-up recommendations: Pt seen and evaluated in treatment team. Chart reviewed.  Pt stable for and requesting discharge home with BF. Pt contracting for safety and does not currently meet [facility] involuntary commitment criteria for continued hospitalization against her will.  Mental health treatment, medication management and continued sobriety will mitigate against the potential increased risk  of harm to self and/or others.  Discussed the importance of recovery further with pt, as well as, tools to move forward in a healthy & safe manner.  Pt agreeable with the plan.  Discussed with the team.  Please see orders, follow up appointments per AVS ([facility]) and full discharge summary.  Recommend follow up with AA.  Diet: Regular.  Activity: As tolerated.     Christine Dickson 07/18/2012, 2:37 PM

## 2012-07-18 NOTE — Progress Notes (Signed)
  Note deleted as written on wrong patient Christine Dickson 07/19/2012 3:12 PM

## 2012-07-18 NOTE — Progress Notes (Signed)
Patient ID: Christine Dickson, female   DOB: 03/28/1981, 31 y.o.   MRN: [id number] She has been up and about and to groups, interacting with peers and staff. Stated she was ready to go home today.

## 2012-07-18 NOTE — Progress Notes (Signed)
Patient ID: Christine Dickson, female   DOB: August 28, 1981, 31 y.o.   MRN: [id number] Writer reviewed pt discharge instructions with pt including medications, follow up care and crisis intervention. Pt acknowledged understanding of instructions and states that she has no reservations about leaving [facility] at this time. Pt denies SI/HI and AVH. Pt mood and affect are appropriate to the situation. Writer returned pt belongings from locker, and the pt is released into his own care.

## 2012-07-18 NOTE — Progress Notes (Signed)
[facility] Group Notes:  (Counselor/Nursing/MHT/Case Management/Adjunct)  07/18/2012 3:29 PM  Type of Therapy:  Psychoeducational Skills  Participation Level:  Active  Participation Quality:  Appropriate, Attentive and Sharing  Affect:  Appropriate  Cognitive:  Alert, Appropriate and Oriented  Insight:  Good  Engagement in Group:  Good  Engagement in Therapy:  n/a  Modes of Intervention:  Activity, Education, Problem-solving, Socialization and Support  Summary of Progress/Problems: Maryuri attended Psychoeducational group that focused on using quality time with support systems/individuals to engage in healthy coping skills. [place] participated in activity guessing about self and peers. Anhar was active while group discussed who their supports are, how they can spend positive quality time with them as a coping skill and a way to strengthen their relationship. She was given a homework assignment to find two ways to improve her support systems and 20 activities she can do to spend quality time with supports.    Wandra Scot 07/18/2012, 3:29 PM

## 2012-07-18 NOTE — Progress Notes (Signed)
[facility] Case Management Discharge Plan:  Will you be returning to the same living situation after discharge: Yes,  with boyfriend At discharge, do you have transportation home?:Yes,  boyfriend Do you have the ability to pay for your medications:Yes,  mental health  Interagency Information:     Release of information consent forms completed and in the chart;  Patient's signature needed at discharge.  Patient to Follow up at:  Follow-up Information    [facility] (Continue to call Aurea Graff to get set up for an intake appointment)    Contact information:   [address]   [place]  [phone]      Follow up with [facility]. (Walk-in between 8 and 9 M-F for your hospital follow up appointment.  this is where you will se psychiatrist for your meds)    Contact information:   [address]  [place]  [phone]         Patient denies SI/HI:   Yes,  yes    Safety Planning and Suicide Prevention discussed:  Yes,  yes  Barrier to discharge identified:No.  Summary and Recommendations:   Ida Rogue 07/18/2012, 1:18 PM

## 2012-07-18 NOTE — Progress Notes (Signed)
Patient ID: Christine Dickson, female   DOB: [birth year], 31 y.o.   MRN: [id number] D: Pt. In bed, eyes closed. A: Staff will monitor q57min for safety. R: Pt. Resp. Even, unlabored, no distress noted. Pt. Is safe on the unit.

## 2012-07-18 NOTE — Progress Notes (Signed)
07/18/2012         Time: 1500      Group Topic/Focus: The focus of the group is on enhancing the patients' ability to utilize positive relaxation strategies by practicing several that can be used at discharge.  Participation Level: Active  Participation Quality: Appropriate and Attentive  Affect: Appropriate  Cognitive: Oriented   Additional Comments: Patient practiced relaxation techniques, was able to identify positive techniques she can use upon discharge to help relax.   Nyzier Boivin 07/18/2012 3:50 PM

## 2012-07-20 NOTE — Progress Notes (Signed)
Patient Discharge Instructions:  After Visit Summary (AVS):   Faxed to:  07/20/2012 Psychiatric Admission Assessment Note:   Faxed to:  07/20/2012 Suicide Risk Assessment - Discharge Assessment:   Faxed to:  07/20/2012 Faxed/Sent to the Next Level Care provider:  07/20/2012  Faxed to [facility] @ [phone] And to [facility] @ [phone]  Wandra Scot, 07/20/2012, 6:14 PM

## 2012-08-03 NOTE — Discharge Summary (Signed)
Physician Discharge Summary Note  Patient:  Christine Dickson is an 31 y.o., female MRN:  [id number] DOB:  Apr 10, 1981 Patient phone:  [phone] (home)  Patient address:   [address] [place] [address]  Date of Admission:  07/16/2012 Date of Discharge: 07/18/12  Reason for Admission: see H&P. Principal Problem:  *Alcohol dependence Active Problems:  Substance induced mood disorder  Discharge Diagnoses: Alcohol Use Disorder; SIMD, resolving   Past Medical History   Diagnosis  Date   .  Depression      Level of Care:  OP  Hospital Course:  Pt admitted for crisis stabilization, detox and treatment. Pt attended all therapeutic groups, was active in her treatment planning process and agreed to her current medication regimen for further stability during recovery.  There were no acute issues during treatment and she was open to further residential substance abuse Tx.  All labs were reviewed with her in great detail and medical needs were addressed.  No acute safety issues were noted on the unit. Medications were reviewed with pt and medication education was provided. Mental health treatment, medication management and continued sobriety will mitigate against any increased risk of harm to self and/or others.  Discussed the importance of recovery with pt, as well as, tools to move forward in a healthy & safe manner using the 12 Step Process.    Consults: none.  Significant Diagnostic Studies:  See labs.  Discharge Vitals:   Blood pressure 125/82, pulse 88, temperature 97.7 F (36.5 C), temperature source Oral, resp. rate 16, height 5\' 8"  (1.727 m), weight 54.885 kg (121 lb).  Physical Findings: AIMS: Facial and Oral Movements Muscles of Facial Expression: None, normal Lips and Perioral Area: None, normal Jaw: None, normal Tongue: None, normal,Extremity Movements Upper (arms, wrists, hands, fingers): None, normal Lower (legs, knees, ankles, toes): None, normal, Trunk  Movements Neck, shoulders, hips: None, normal, Overall Severity Severity of abnormal movements (highest score from questions above): None, normal Incapacitation due to abnormal movements: None, normal Patient's awareness of abnormal movements (rate only patient's report): No Awareness, Dental Status Current problems with teeth and/or dentures?: No Does patient usually wear dentures?: No  CIWA:  CIWA-Ar Total: 0  COWS:  COWS Total Score: 1   Mental Status Exam: See Mental Status Examination and Suicide Risk Assessment completed by Attending Physician prior to discharge.  Discharge destination:  Home  Is patient on multiple antipsychotic therapies at discharge:  No   Has Patient had three or more failed trials of antipsychotic monotherapy by history:  No  Recommended Plan for Multiple Antipsychotic Therapies: NA.    Medication List     As of 08/03/2012 12:33 PM    TAKE these medications      Indication    citalopram 20 MG tablet   Commonly known as: CELEXA   Take 1 tablet (20 mg total) by mouth daily. For depression/anxiety       pantoprazole 20 MG tablet   Commonly known as: PROTONIX   Take 1 tablet (20 mg total) by mouth 2 (two) times daily before a meal. For stomach/GERD       traZODone 100 MG tablet   Commonly known as: DESYREL   Take 1 tablet (100 mg total) by mouth at bedtime as needed for sleep.            Follow-up Information    [facility] (Continue to call Aurea Graff to get set up for an intake appointment)  Contact information:   [address]   [place]  [phone]      Follow up with [facility]. (Walk-in between 8 and 9 M-F for your hospital follow up appointment.  this is where you will se psychiatrist for your meds)    Contact information:   [address]  [place]  [phone]         Plan Of Care/Follow-up recommendations: Pt seen and evaluated in treatment team. Chart reviewed. Pt stable for and requesting  discharge home with BF. Pt contracting for safety and does not currently meet [place] involuntary commitment criteria for continued hospitalization against her will. Mental health treatment, medication management and continued sobriety will mitigate against the potential increased risk of harm to self and/or others. Discussed the importance of recovery further with pt, as well as, tools to move forward in a healthy & safe manner. Pt agreeable with the plan. Discussed with the team. Recommend follow up with AA. Diet: Regular. Activity: As tolerated.   Signed: Lupe Carney 08/03/2012, 12:33 PM

## 2012-12-22 ENCOUNTER — Encounter (HOSPITAL_COMMUNITY): Payer: Self-pay | Admitting: *Deleted

## 2012-12-22 ENCOUNTER — Emergency Department (HOSPITAL_COMMUNITY)
Admission: EM | Admit: 2012-12-22 | Discharge: 2012-12-23 | Disposition: A | Discharge status: Home / Self Care | Payer: Self-pay | Attending: Emergency Medicine | Admitting: Emergency Medicine

## 2012-12-22 DIAGNOSIS — F1020 Alcohol dependence, uncomplicated: Secondary | ICD-10-CM | POA: Insufficient documentation

## 2012-12-22 DIAGNOSIS — Z3202 Encounter for pregnancy test, result negative: Secondary | ICD-10-CM | POA: Insufficient documentation

## 2012-12-22 DIAGNOSIS — Z79899 Other long term (current) drug therapy: Secondary | ICD-10-CM | POA: Insufficient documentation

## 2012-12-22 DIAGNOSIS — F411 Generalized anxiety disorder: Secondary | ICD-10-CM | POA: Insufficient documentation

## 2012-12-22 DIAGNOSIS — F17200 Nicotine dependence, unspecified, uncomplicated: Secondary | ICD-10-CM | POA: Insufficient documentation

## 2012-12-22 DIAGNOSIS — F3289 Other specified depressive episodes: Secondary | ICD-10-CM | POA: Insufficient documentation

## 2012-12-22 DIAGNOSIS — F329 Major depressive disorder, single episode, unspecified: Secondary | ICD-10-CM | POA: Insufficient documentation

## 2012-12-22 LAB — URINALYSIS, ROUTINE W REFLEX MICROSCOPIC
Glucose, UA: NEGATIVE mg/dL
Ketones, ur: NEGATIVE mg/dL
Leukocytes, UA: NEGATIVE
Nitrite: NEGATIVE
Specific Gravity, Urine: 1.008 (ref 1.005–1.030)
pH: 6 (ref 5.0–8.0)

## 2012-12-22 LAB — COMPREHENSIVE METABOLIC PANEL
Albumin: 4.2 g/dL (ref 3.5–5.2)
Alkaline Phosphatase: 50 U/L (ref 39–117)
BUN: 6 mg/dL (ref 6–23)
Creatinine, Ser: 0.5 mg/dL (ref 0.50–1.10)
GFR calc Af Amer: >90 mL/min (ref >90)
Glucose, Bld: 77 mg/dL (ref 70–99)
Total Bilirubin: 0.2 mg/dL — ABNORMAL LOW (ref 0.3–1.2)
Total Protein: 7.8 g/dL (ref 6.0–8.3)

## 2012-12-22 LAB — CBC WITH DIFFERENTIAL/PLATELET
Basophils Relative: 1 % (ref 0–1)
Eosinophils Absolute: 0.2 10*3/uL (ref 0.0–0.7)
HCT: 43.2 % (ref 36.0–46.0)
Hemoglobin: 15.8 g/dL — ABNORMAL HIGH (ref 12.0–15.0)
Lymphs Abs: 2.2 10*3/uL (ref 0.7–4.0)
MCH: 34.8 pg — ABNORMAL HIGH (ref 26.0–34.0)
MCHC: 36.6 g/dL — ABNORMAL HIGH (ref 30.0–36.0)
MCV: 95.2 fL (ref 78.0–100.0)
Monocytes Absolute: 0.4 10*3/uL (ref 0.1–1.0)
Monocytes Relative: 6 % (ref 3–12)
RBC: 4.54 MIL/uL (ref 3.87–5.11)

## 2012-12-22 LAB — ETHANOL: Alcohol, Ethyl (B): 392 mg/dL — ABNORMAL HIGH (ref 0–11)

## 2012-12-22 LAB — RAPID URINE DRUG SCREEN, HOSP PERFORMED
Amphetamines: NOT DETECTED
Opiates: NOT DETECTED

## 2012-12-22 LAB — POCT PREGNANCY, URINE: Preg Test, Ur: NEGATIVE

## 2012-12-22 LAB — ACETAMINOPHEN LEVEL: Acetaminophen (Tylenol), Serum: <15 ug/mL (ref 10–30)

## 2012-12-22 MED ORDER — LORAZEPAM 1 MG PO TABS
1.0000 mg | ORAL_TABLET | Freq: Three times a day (TID) | ORAL | Status: DC | PRN
  Filled 2012-12-22: qty 1

## 2012-12-22 MED ORDER — IBUPROFEN 200 MG PO TABS
600.0000 mg | ORAL_TABLET | Freq: Three times a day (TID) | ORAL | Status: DC | PRN
  Filled 2012-12-22: qty 1

## 2012-12-22 NOTE — ED Notes (Signed)
In July last year - dui.  etoh abuse. Since July 2013 pt. Exp. More depression. Hx. Of drinking mouth wash or anything with alcohol. Been in behavioral.

## 2012-12-22 NOTE — ED Provider Notes (Signed)
History   This chart was scribed for non-physician practitioner working with Gerhard Munch, MD by Frederik Pear, ED Scribe. This patient was seen in room TR06C/TR06C and the patient's care was started at 2310.   CSN: [id number]  Arrival date & time 12/22/12  2101   First MD Initiated Contact with Patient 12/22/12 2310      Chief Complaint  Patient presents with  . Medical Clearance    (Consider location/radiation/quality/duration/timing/severity/associated sxs/prior treatment) Patient is a 32 y.o. female presenting with general illness. The history is provided by the patient. No language interpreter was used.  Illness  The current episode started more than 2 weeks ago. The onset was gradual. The problem occurs continuously. The problem has been unchanged. The problem is severe. Relieved by: drinking more alcohol. Nothing aggravates the symptoms. She has received no recent medical care.    Christine Dickson is a 32 y.o. female who presents to the Emergency Department with a chief complain of medical clearance from ETOH. She reports that she has been drinking most of her life and has no preferential drink of choice, but will drink anything with ETOH including mouthwash. She states that her last drink was earlier this afternoon. Her boyfriend reports that she was involved in a DUI in July 2013 She denies any hematochezia or hematemesis. He reports that she was admitted to [facility] in on 07/15/12 for 3-4 day and states that after her discharge that she significantly improved until Dec when she began feeling more depressed. She is currently a pt at [facility] for anxiety and depression. She is a current, every day 1 pack a day smoker for the last 10 years.    Past Medical History  Diagnosis Date  . Depression     History reviewed. No pertinent past surgical history.  No family history on file.  History  Substance Use Topics  . Smoking status: Current Every Day Smoker -- 1.0  packs/day for 10 years  . Smokeless tobacco: Not on file  . Alcohol Use: Yes     Comment: 1 pint twice weekly    OB History    Grav Para Term Preterm Abortions TAB SAB Ect Mult Living                  Review of Systems  Psychiatric/Behavioral:       Depression  All other systems reviewed and are negative.    Allergies  Review of patient's allergies indicates no known allergies.  Home Medications   Current Outpatient Rx  Name  Route  Sig  Dispense  Refill  . CITALOPRAM HYDROBROMIDE 20 MG PO TABS   Oral   Take 1 tablet (20 mg total) by mouth daily. For depression/anxiety   30 tablet   0   . TRAZODONE HCL 100 MG PO TABS   Oral   Take 100 mg by mouth at bedtime.           BP 132/95  Pulse 112  Temp 98.5 F (36.9 C) (Oral)  Resp 16  SpO2 95%  LMP 11/24/2012  Physical Exam  Nursing note and vitals reviewed. Constitutional: She is oriented to person, place, and time. She appears well-developed and well-nourished. No distress.  HENT:  Head: Normocephalic and atraumatic.  Eyes: EOM are normal. Pupils are equal, round, and reactive to light.  Neck: Normal range of motion. Neck supple. No tracheal deviation present.  Cardiovascular: Normal rate.   Pulmonary/Chest: Effort normal. No respiratory distress.  Abdominal: Soft. She  exhibits no distension.  Musculoskeletal: Normal range of motion. She exhibits no edema.  Neurological: She is alert and oriented to person, place, and time.  Skin: Skin is warm and dry.  Psychiatric: She has a normal mood and affect. Her behavior is normal.    ED Course  Procedures (including critical care time)  DIAGNOSTIC STUDIES: Oxygen Saturation is 95% on room air, adequate by my interpretation.    COORDINATION OF CARE:  23:13- Discussed planned course of treatment with the patient, including a consult with behavioral health, who is agreeable at this time.   Labs Reviewed  CBC WITH DIFFERENTIAL - Abnormal; Notable for the  following:    Hemoglobin 15.8 (*)     MCH 34.8 (*)     MCHC 36.6 (*)     All other components within normal limits  COMPREHENSIVE METABOLIC PANEL - Abnormal; Notable for the following:    AST 53 (*)     Total Bilirubin 0.2 (*)     All other components within normal limits  ETHANOL - Abnormal; Notable for the following:    Alcohol, Ethyl (B) 392 (*)     All other components within normal limits  URINE RAPID DRUG SCREEN (HOSP PERFORMED)  ACETAMINOPHEN LEVEL  URINALYSIS, ROUTINE W REFLEX MICROSCOPIC  POCT PREGNANCY, URINE   No results found.   No diagnosis found.  Patient requesting alcohol detox.  Discussed with ACT.  Moved to pod C awaiting ACT evaluation.  MDM   I personally performed the services described in this documentation, which was scribed in my presence. The recorded information has been reviewed and is accurate.        Jimmye Norman, NP 12/23/12 1133

## 2012-12-22 NOTE — ED Notes (Signed)
Patient has already attempted to get help for the alcohol, however, after 13days out of treatment she started drinking.  Now, she is here voluntarily to get help with her drinking.

## 2012-12-23 LAB — ETHANOL: Alcohol, Ethyl (B): 55 mg/dL — ABNORMAL HIGH (ref 0–11)

## 2012-12-23 MED ORDER — LORAZEPAM 1 MG PO TABS: 1.0000 mg | ORAL_TABLET | Freq: Four times a day (QID) | ORAL | Status: DC | PRN

## 2012-12-23 MED ORDER — FOLIC ACID 1 MG PO TABS
1.0000 mg | ORAL_TABLET | Freq: Every day | ORAL | Status: DC
  Administered 2012-12-23: 1 mg via ORAL
  Filled 2012-12-23: qty 1

## 2012-12-23 MED ORDER — VITAMIN B-1 100 MG PO TABS
100.0000 mg | ORAL_TABLET | Freq: Every day | ORAL | Status: DC
  Administered 2012-12-23: 100 mg via ORAL
  Filled 2012-12-23: qty 1

## 2012-12-23 MED ORDER — LORAZEPAM 2 MG/ML IJ SOLN: 1.0000 mg | Freq: Four times a day (QID) | INTRAMUSCULAR | Status: DC | PRN

## 2012-12-23 MED ORDER — ADULT MULTIVITAMIN W/MINERALS CH
1.0000 | ORAL_TABLET | Freq: Every day | ORAL | Status: DC
  Administered 2012-12-23: 1 via ORAL
  Filled 2012-12-23: qty 1

## 2012-12-23 MED ORDER — LORAZEPAM 1 MG PO TABS: 0.0000 mg | ORAL_TABLET | Freq: Two times a day (BID) | ORAL | Status: DC

## 2012-12-23 MED ORDER — LORAZEPAM 1 MG PO TABS
0.0000 mg | ORAL_TABLET | Freq: Four times a day (QID) | ORAL | Status: DC
  Administered 2012-12-23: 1 mg via ORAL
  Filled 2012-12-23: qty 1

## 2012-12-23 MED ORDER — THIAMINE HCL 100 MG/ML IJ SOLN: 100.0000 mg | Freq: Every day | INTRAMUSCULAR | Status: DC

## 2012-12-23 NOTE — BH Assessment (Signed)
[facility] Assessment Progress Note      Met with Christine Dickson and her companion to introduce myself and explain the process for referral to a detox facility.  I explained to her that she will not be able to be considered for detox until her BAL is below 200 and that it may take several hours for it to reach that level.  At that point, one of our counselors will assess her and make the appropriate referral.  Spoke with Christine Dickson at [facility] who indicates they are accepting admissions for Saturday.  Provided Christine Dickson with a sandwich and a water.  Will continue to follow.

## 2012-12-23 NOTE — BH Assessment (Signed)
Assessment Note   Christine Dickson is an 32 y.o. female who presents seeking detox from alcohol.  She reports she drinks as much as she can get, beer, wine, liquor, mouthwash, and most recently had 3 40 oz beers on 12/22/12.  She was treated for detox in September of 2013 and was sober for 2 weeks but began drinking again.  She has gotten a DUI and found that it is hurting her job and her relationship.  She says she wants to stop drinking and is hoping to be placed for detox.  Spoke with Toniann Fail at [facility] who reports they are reviewing referals for placement this morning.    Axis I: Depressive Disorder NOS and Alcohol Dependence Axis II: Deferred Axis III:  Past Medical History  Diagnosis Date  . Depression    Axis IV: problems related to legal system/crime and problems with primary support group Axis V: 41-50 serious symptoms  Past Medical History:  Past Medical History  Diagnosis Date  . Depression     History reviewed. No pertinent past surgical history.  Family History: No family history on file.  Social History:  reports that she has been smoking.  She does not have any smokeless tobacco history on file. She reports that  drinks alcohol. She reports that she does not use illicit drugs.  Additional Social History:  Alcohol / Drug Use History of alcohol / drug use?: Yes Longest period of sobriety (when/how long): 2 weeks Negative Consequences of Use: Legal;Work / School;Personal relationships Substance #1 Name of Substance 1: Alcohol-beer, liquor, mouthwash, whatever she can get her hands on 1 - Age of First Use: 15 1 - Amount (size/oz): 3 40s, 12 pack, whatever whenever 1 - Frequency: daily 1 - Duration: 7 years 1 - Last Use / Amount: 12/22/12 3 40 oz beers  CIWA: CIWA-Ar BP: 118/77 mmHg Pulse Rate: 90 Nausea and Vomiting: no nausea and no vomiting Tactile Disturbances: none Tremor: not visible, but can be felt fingertip to fingertip Auditory Disturbances: not  present Paroxysmal Sweats: barely perceptible sweating, palms moist Visual Disturbances: not present Anxiety: no anxiety, at ease Headache, Fullness in Head: mild Agitation: normal activity Orientation and Clouding of Sensorium: oriented and can do serial additions CIWA-Ar Total: 4 COWS:    Allergies: No Known Allergies  Home Medications:  (Not in a hospital admission)  OB/GYN Status:  Patient's last menstrual period was 11/24/2012.  General Assessment Data Location of Assessment: [facility] ED Living Arrangements: Spouse/significant other Can pt return to current living arrangement?: Yes Admission Status: Voluntary Is patient capable of signing voluntary admission?: Yes Transfer from: Acute Hospital Referral Source: Self/Family/Friend  Education Status Is patient currently in school?: No Highest grade of school patient has completed: some college  Risk to self Suicidal Ideation: No Suicidal Intent: No Is patient at risk for suicide?: No Suicidal Plan?: No Access to Means: No What has been your use of drugs/alcohol within the last 12 months?: drinking daily Previous Attempts/Gestures: No Intentional Self Injurious Behavior: None Family Suicide History: No Recent stressful life event(s): Legal Issues;Turmoil (Comment) Persecutory voices/beliefs?: No Depression: Yes Depression Symptoms: Loss of interest in usual pleasures;Feeling worthless/self pity;Feeling angry/irritable;Guilt;Fatigue;Insomnia;Isolating Substance abuse history and/or treatment for substance abuse?: Yes Suicide prevention information given to non-admitted patients: Yes  Risk to Others Homicidal Ideation: No Thoughts of Harm to Others: No Current Homicidal Intent: No Current Homicidal Plan: No Access to Homicidal Means: No History of harm to others?: No Assessment of Violence: None Noted Does patient have access  to weapons?: Yes (Comment) (boyfriend has a rifle in the home) Criminal Charges Pending?:  No Does patient have a court date: No  Psychosis Hallucinations: None noted Delusions: None noted  Mental Status Report Appear/Hygiene: Disheveled Eye Contact: Good Motor Activity: Freedom of movement Speech: Logical/coherent Level of Consciousness: Alert Mood: Depressed Affect: Appropriate to circumstance Anxiety Level: None Thought Processes: Coherent;Relevant Judgement: Unimpaired Orientation: Person;Place;Time;Situation Obsessive Compulsive Thoughts/Behaviors: None  Cognitive Functioning Concentration: Decreased Memory: Recent Impaired;Remote Intact IQ: Average Insight: Good Impulse Control: Good Appetite: Good Weight Loss: 0 Weight Gain: 0 Sleep: Decreased Total Hours of Sleep:  (restless) Vegetative Symptoms: None  ADLScreening [facility] Assessment Services) Patient's cognitive ability adequate to safely complete daily activities?: Yes Patient able to express need for assistance with ADLs?: Yes Independently performs ADLs?: Yes (appropriate for developmental age)  Abuse/Neglect [facility]) Physical Abuse: Denies Verbal Abuse: Denies Sexual Abuse: Denies  Prior Inpatient Therapy Prior Inpatient Therapy: Yes Prior Therapy Dates: 07/2012 Prior Therapy Facilty/Provider(s): [facility]  Reason for Treatment: Detox  Prior Outpatient Therapy Prior Outpatient Therapy: Yes Prior Therapy Dates: Ongoing Prior Therapy Facilty/Provider(s): [facility], [facility] Reason for Treatment: Depression, SA  ADL Screening (condition at time of admission) Patient's cognitive ability adequate to safely complete daily activities?: Yes Patient able to express need for assistance with ADLs?: Yes Independently performs ADLs?: Yes (appropriate for developmental age) Weakness of Legs: None Weakness of Arms/Hands: None       Abuse/Neglect Assessment (Assessment to be complete while patient is alone) Physical Abuse: Denies Verbal Abuse: Denies Sexual Abuse: Denies Exploitation of patient/patient's  resources: Denies Self-Neglect: Denies Values / Beliefs Cultural Requests During Hospitalization: None Spiritual Requests During Hospitalization: None   Advance Directives (For Healthcare) Advance Directive: Patient does not have advance directive;Patient would not like information Nutrition Screen- [facility] Adult/WL/AP Patient's home diet: Regular Have you recently lost weight without trying?: No Have you been eating poorly because of a decreased appetite?: No Malnutrition Screening Tool Score: 0  Additional Information 1:1 In Past 12 Months?: No CIRT Risk: No Elopement Risk: No Does patient have medical clearance?: Yes     Disposition:  Disposition Disposition of Patient: Inpatient treatment program Type of inpatient treatment program: Adult  On Site Evaluation by:   Reviewed with Physician:     Steward Ros 12/23/2012 6:57 AM

## 2012-12-23 NOTE — ED Notes (Signed)
Please refer to paper chart during Epic down time.

## 2012-12-23 NOTE — ED Provider Notes (Signed)
  Medical screening examination/treatment/procedure(s) were performed by non-physician practitioner and as supervising physician I was immediately available for consultation/collaboration.    Gerhard Munch, MD 12/23/12 2325

## 2012-12-23 NOTE — ED Notes (Signed)
Talking with intake counselor at [facility] on phone.

## 2012-12-23 NOTE — ED Notes (Signed)
Pt d/c'd. Will go to [facility] from here- boyfriend is driving. Instructions given to go straight there.

## 2013-11-18 ENCOUNTER — Emergency Department (HOSPITAL_COMMUNITY)
Admission: EM | Admit: 2013-11-18 | Discharge: 2013-11-18 | Disposition: A | Discharge status: Other Acute Inpatient Hospital | Payer: Self-pay | Attending: Emergency Medicine | Admitting: Emergency Medicine

## 2013-11-18 ENCOUNTER — Inpatient Hospital Stay (HOSPITAL_COMMUNITY)
Admission: AD | Admit: 2013-11-18 | Discharge: 2013-11-21 | DRG: 897 | Disposition: A | Discharge status: Home / Self Care | Payer: No Typology Code available for payment source | Source: Intra-hospital | Attending: Psychiatry | Admitting: Psychiatry

## 2013-11-18 ENCOUNTER — Encounter (HOSPITAL_COMMUNITY): Payer: Self-pay

## 2013-11-18 ENCOUNTER — Encounter (HOSPITAL_COMMUNITY): Payer: Self-pay | Admitting: Emergency Medicine

## 2013-11-18 DIAGNOSIS — F3289 Other specified depressive episodes: Secondary | ICD-10-CM | POA: Diagnosis present

## 2013-11-18 DIAGNOSIS — F1994 Other psychoactive substance use, unspecified with psychoactive substance-induced mood disorder: Secondary | ICD-10-CM | POA: Diagnosis present

## 2013-11-18 DIAGNOSIS — F17200 Nicotine dependence, unspecified, uncomplicated: Secondary | ICD-10-CM | POA: Insufficient documentation

## 2013-11-18 DIAGNOSIS — F411 Generalized anxiety disorder: Secondary | ICD-10-CM | POA: Diagnosis present

## 2013-11-18 DIAGNOSIS — G4700 Insomnia, unspecified: Secondary | ICD-10-CM | POA: Diagnosis present

## 2013-11-18 DIAGNOSIS — F1020 Alcohol dependence, uncomplicated: Principal | ICD-10-CM | POA: Diagnosis present

## 2013-11-18 DIAGNOSIS — Z3202 Encounter for pregnancy test, result negative: Secondary | ICD-10-CM | POA: Insufficient documentation

## 2013-11-18 DIAGNOSIS — F1010 Alcohol abuse, uncomplicated: Secondary | ICD-10-CM | POA: Diagnosis present

## 2013-11-18 DIAGNOSIS — F329 Major depressive disorder, single episode, unspecified: Secondary | ICD-10-CM | POA: Diagnosis present

## 2013-11-18 DIAGNOSIS — Z79899 Other long term (current) drug therapy: Secondary | ICD-10-CM | POA: Insufficient documentation

## 2013-11-18 LAB — POCT PREGNANCY, URINE: Preg Test, Ur: NEGATIVE

## 2013-11-18 LAB — COMPREHENSIVE METABOLIC PANEL
ALBUMIN: 4 g/dL (ref 3.5–5.2)
ALT: 13 U/L (ref 0–35)
AST: 17 U/L (ref 0–37)
Alkaline Phosphatase: 45 U/L (ref 39–117)
BUN: 6 mg/dL (ref 6–23)
CHLORIDE: 107 meq/L (ref 96–112)
CO2: 25 mEq/L (ref 19–32)
CREATININE: 0.71 mg/dL (ref 0.50–1.10)
Calcium: 8.6 mg/dL (ref 8.4–10.5)
GFR calc Af Amer: >90 mL/min (ref >90)
GFR calc non Af Amer: >90 mL/min (ref >90)
Glucose, Bld: 85 mg/dL (ref 70–99)
Potassium: 4 mEq/L (ref 3.7–5.3)
Sodium: 144 mEq/L (ref 137–147)
TOTAL PROTEIN: 7 g/dL (ref 6.0–8.3)
Total Bilirubin: <0.2 mg/dL — ABNORMAL LOW (ref 0.3–1.2)

## 2013-11-18 LAB — ACETAMINOPHEN LEVEL

## 2013-11-18 LAB — SALICYLATE LEVEL: SALICYLATE LVL: 2.2 mg/dL — AB (ref 2.8–20.0)

## 2013-11-18 LAB — CBC
HEMATOCRIT: 38 % (ref 36.0–46.0)
Hemoglobin: 13.1 g/dL (ref 12.0–15.0)
MCH: 31.1 pg (ref 26.0–34.0)
MCHC: 34.5 g/dL (ref 30.0–36.0)
MCV: 90.3 fL (ref 78.0–100.0)
PLATELETS: 319 10*3/uL (ref 150–400)
RBC: 4.21 MIL/uL (ref 3.87–5.11)
RDW: 12.4 % (ref 11.5–15.5)
WBC: 9 10*3/uL (ref 4.0–10.5)

## 2013-11-18 LAB — RAPID URINE DRUG SCREEN, HOSP PERFORMED
AMPHETAMINES: NOT DETECTED
BENZODIAZEPINES: NOT DETECTED
Barbiturates: NOT DETECTED
COCAINE: NOT DETECTED
OPIATES: NOT DETECTED
Tetrahydrocannabinol: NOT DETECTED

## 2013-11-18 LAB — ETHANOL: ALCOHOL ETHYL (B): 308 mg/dL — AB (ref 0–11)

## 2013-11-18 MED ORDER — CHLORDIAZEPOXIDE HCL 25 MG PO CAPS: 25.0000 mg | ORAL_CAPSULE | Freq: Every day | ORAL | Status: DC

## 2013-11-18 MED ORDER — ACETAMINOPHEN 325 MG PO TABS
650.0000 mg | ORAL_TABLET | Freq: Four times a day (QID) | ORAL | Status: DC | PRN
  Administered 2013-11-18 – 2013-11-20 (×3): 650 mg via ORAL
  Filled 2013-11-18 (×3): qty 2

## 2013-11-18 MED ORDER — CHLORDIAZEPOXIDE HCL 25 MG PO CAPS
50.0000 mg | ORAL_CAPSULE | Freq: Once | ORAL | Status: AC
  Administered 2013-11-18: 50 mg via ORAL
  Filled 2013-11-18: qty 2

## 2013-11-18 MED ORDER — THIAMINE HCL 100 MG/ML IJ SOLN
100.0000 mg | Freq: Once | INTRAMUSCULAR | Status: AC
  Administered 2013-11-18: 100 mg via INTRAMUSCULAR
  Filled 2013-11-18: qty 2

## 2013-11-18 MED ORDER — ONDANSETRON 4 MG PO TBDP: 4.0000 mg | ORAL_TABLET | Freq: Four times a day (QID) | ORAL | Status: DC | PRN

## 2013-11-18 MED ORDER — MAGNESIUM HYDROXIDE 400 MG/5ML PO SUSP: 30.0000 mL | Freq: Every day | ORAL | Status: DC | PRN

## 2013-11-18 MED ORDER — CHLORDIAZEPOXIDE HCL 25 MG PO CAPS
25.0000 mg | ORAL_CAPSULE | Freq: Four times a day (QID) | ORAL | Status: AC
  Administered 2013-11-18 – 2013-11-20 (×6): 25 mg via ORAL
  Filled 2013-11-18 (×6): qty 1

## 2013-11-18 MED ORDER — HYDROXYZINE HCL 25 MG PO TABS
25.0000 mg | ORAL_TABLET | Freq: Four times a day (QID) | ORAL | Status: DC | PRN
  Administered 2013-11-18: 25 mg via ORAL
  Filled 2013-11-18: qty 1

## 2013-11-18 MED ORDER — VITAMIN B-1 100 MG PO TABS
100.0000 mg | ORAL_TABLET | Freq: Every day | ORAL | Status: DC
  Administered 2013-11-19 – 2013-11-20 (×2): 100 mg via ORAL
  Filled 2013-11-18 (×4): qty 1

## 2013-11-18 MED ORDER — CHLORDIAZEPOXIDE HCL 25 MG PO CAPS
25.0000 mg | ORAL_CAPSULE | ORAL | Status: DC
Start: 2013-11-21 — End: 2013-11-21

## 2013-11-18 MED ORDER — LOPERAMIDE HCL 2 MG PO CAPS
2.0000 mg | ORAL_CAPSULE | ORAL | Status: DC | PRN
Start: 2013-11-18 — End: 2013-11-21

## 2013-11-18 MED ORDER — ALUM & MAG HYDROXIDE-SIMETH 200-200-20 MG/5ML PO SUSP: 30.0000 mL | ORAL | Status: DC | PRN

## 2013-11-18 MED ORDER — CHLORDIAZEPOXIDE HCL 25 MG PO CAPS: 25.0000 mg | ORAL_CAPSULE | Freq: Four times a day (QID) | ORAL | Status: DC | PRN

## 2013-11-18 MED ORDER — CHLORDIAZEPOXIDE HCL 25 MG PO CAPS
25.0000 mg | ORAL_CAPSULE | Freq: Three times a day (TID) | ORAL | Status: AC
  Administered 2013-11-20 (×3): 25 mg via ORAL
  Filled 2013-11-18 (×3): qty 1

## 2013-11-18 MED ORDER — ADULT MULTIVITAMIN W/MINERALS CH
1.0000 | ORAL_TABLET | Freq: Every day | ORAL | Status: DC
  Administered 2013-11-19 – 2013-11-20 (×2): 1 via ORAL
  Filled 2013-11-18 (×4): qty 1

## 2013-11-18 NOTE — Progress Notes (Signed)
Psychoeducational Group Note  Date:  11/18/2013 Time:  2000  Group Topic/Focus:  AA group  Participation Level: Did Not Attend  Participation Quality:  Not Applicable  Affect:  Not Applicable  Cognitive:  Not Applicable  Insight:  Not Applicable  Engagement in Group: Not Applicable  Additional Comments:  Pt is sleeping during group time.  Kaleen OdeaCOOKE, Rosa Gambale R 11/18/2013, 9:57 PM

## 2013-11-18 NOTE — Progress Notes (Signed)
D.  Pt in bed on approach, received a loading dose of Librium 50 after having a CIWA of 22 (see CIWA flow sheet).  Pt did not feel well enough to attend evening AA group and remained in bed.  Denies SI/HI/hallucinations at this time.  A.  Support and encouragement offered  R.  Pt remains safe on unit, will continue to monitor.

## 2013-11-18 NOTE — ED Notes (Signed)
Lorin PicketScott is the pt friend and he has to leave and go to work. He states the pt has his phone number and can call him at anytime if she needs anything

## 2013-11-18 NOTE — BH Assessment (Signed)
Tele Assessment Note   Christine Dickson is an 33 y.o. female [facility]. Pt reported that she has been drinking everything she can get her hands on to include beer, wine, liquor, mouthwash.Pt reported that she drinks everyday because her drinking binges are out of control. Pt reported that she just needs help and that she can not continue to live like this. [facility], but reported that she relasped. Pt reported being negative for SI/HI, no AH/VH noted.    Axis I: Alcohol Abuse Axis II: Deferred Axis III:  Past Medical History  Diagnosis Date  . Depression    Axis IV: occupational problems and problems with primary support group Axis V: 41-50 serious symptoms  Past Medical History:  Past Medical History  Diagnosis Date  . Depression     History reviewed. No pertinent past surgical history.  Family History: History reviewed. No pertinent family history.  Social History:  reports that she has been smoking.  She does not have any smokeless tobacco history on file. She reports that she drinks about 22.9 ounces of alcohol per week. She reports that she does not use illicit drugs.  Additional Social History:  Alcohol / Drug Use Pain Medications: none noted Prescriptions: none noted Over the Counter: none noted History of alcohol / drug use?: Yes Negative Consequences of Use: Financial;Legal;Personal relationships Withdrawal Symptoms: Tremors;Irritability;Agitation;Nausea / Vomiting  CIWA: CIWA-Ar BP: 105/59 mmHg Pulse Rate: 90 COWS:    Allergies: No Known Allergies  Home Medications:  (Not in a hospital admission)  OB/GYN Status:  Patient's last menstrual period was 10/22/2013.  General Assessment Data [facility] ACT Assessment: Yes Is this a Tele or Face-to-Face Assessment?: Tele Assessment Is this an Initial Assessment or a Re-assessment for this encounter?: Initial  Assessment Living Arrangements: Spouse/significant other Can pt return to current living arrangement?: Yes Admission Status: Voluntary Is patient capable of signing voluntary admission?: Yes Transfer from: Home Referral Source: Self/Family/Friend  Medical Screening Exam [facility] Walk-in ONLY) Medical Exam completed: No Reason for MSE not completed: Patient Refused (not a walk in)  [facility] Living Arrangements: Spouse/significant other [facility] of Psych[facility]rist: [facility] of Therapist: [facility]  Education Status Is patient currently in school?: No Contact person: boyfriend  Risk to self Suicidal Ideation: No Suicidal Intent: No Is patient at risk for suicide?: No Suicidal Plan?: No Access to Means: No What has been your use of drugs/alcohol within the last 12 months?: alcohol Previous Attempts/Gestures: No How many times?: 0 Other Self Harm Risks: none noted Triggers for Past Attempts: None known Intentional Self Injurious Behavior: None Family Suicide History: No Recent stressful life event(s): Legal Issues Persecutory voices/beliefs?: No Depression: No Depression Symptoms: Feeling worthless/self pity Substance abuse history and/or treatment for substance abuse?: Yes Suicide prevention information given to non-admitted patients: Not applicable  Risk to Others Homicidal Ideation: No Thoughts of Harm to Others: No Current Homicidal Intent: No Current Homicidal Plan: No Access to Homicidal Means: No Identified Victim: none noted  History of harm to others?: No Assessment of Violence: None Noted Violent Behavior Description: none noted Does patient have access to weapons?: No Criminal Charges Pending?: No Does patient have a court date: No  Psychosis Hallucinations: None noted Delusions: None noted  Mental Status Report Appear/Hygiene: Disheveled Eye Contact: Poor Motor Activity: Restlessness Speech: Slurred Level of Consciousness:  Sleeping;Drowsy Mood: Depressed Affect: Depressed Anxiety Level: Minimal Thought Processes: Coherent Judgement:  Unimpaired Orientation: Person;Place;Time;Situation Obsessive Compulsive Thoughts/Behaviors: None  Cognitive Functioning Concentration: Decreased Memory: Recent Intact;Remote Intact IQ: Average Insight: Poor Impulse Control: Poor Appetite: Poor Weight Loss: 0 Weight Gain: 0 Sleep: Decreased Total Hours of Sleep: 8 Vegetative Symptoms: None  ADLScreening [facility] Assessment Services) Patient's cognitive ability adequate to safely complete daily activities?: Yes Patient able to express need for assistance with ADLs?: Yes Independently performs ADLs?: Yes (appropriate for developmental age)  Prior Inpatient Therapy Prior Inpatient Therapy: Yes Prior Therapy Dates: 12/2012 Prior Therapy Facilty/Provider(s): [facility] Reason for Treatment: Detox  Prior Outpatient Therapy Prior Outpatient Therapy: Yes Prior Therapy Dates: 2014 Prior Therapy Facilty/Provider(s): [facility] Reason for Treatment: depression  ADL Screening (condition at time of admission) Patient's cognitive ability adequate to safely complete daily activities?: Yes Is the patient deaf or have difficulty hearing?: No Does the patient have difficulty seeing, even when wearing glasses/contacts?: No Does the patient have difficulty concentrating, remembering, or making decisions?: No Patient able to express need for assistance with ADLs?: Yes Does the patient have difficulty dressing or bathing?: No Independently performs ADLs?: Yes (appropriate for developmental age) Does the patient have difficulty walking or climbing stairs?: No Weakness of Legs: None Weakness of Arms/Hands: None  Home Assistive Devices/Equipment Home Assistive Devices/Equipment: None  Therapy Consults (therapy consults require a physician order) PT Evaluation Needed: No OT Evalulation Needed: No SLP Evaluation Needed: No   Values /  Beliefs Cultural Requests During Hospitalization: None Spiritual Requests During Hospitalization: None   Advance Directives (For Healthcare) Advance Directive: Patient does not have advance directive Pre-existing out of facility DNR order (yellow form or pink MOST form): No Nutrition Screen- [facility] Adult/[facility]/AP Patient's home diet: Regular  Additional Information 1:1 In Past 12 Months?: No CIRT Risk: No Elopement Risk: No Does patient have medical clearance?: Yes     Disposition: Pt was ran by Nanine Means NP, pt was accepted to [facility] on [address].  Disposition Initial Assessment Completed for this Encounter: Yes Disposition of Patient: Inpatient treatment program Type of inpatient treatment program: Adult  Buford Dresser 11/18/2013 3:43 PM

## 2013-11-18 NOTE — ED Notes (Signed)
[facility], but bed is not ready, will call when bed is ready.

## 2013-11-18 NOTE — ED Notes (Signed)
[place], PA notified of pt blood pressure, this nurse notified pt ok for transfer

## 2013-11-18 NOTE — ED Notes (Signed)
[facility] arrived, belongings given to [facility].

## 2013-11-18 NOTE — BH Assessment (Signed)
Pt was ran by Nanine MeansJamison Lord NP, [facility] [address]. Pt will be going to [facility] when other patient is discharged, Christina [facility] at [facility] made aware.

## 2013-11-18 NOTE — Progress Notes (Signed)
On admission pt is very agitated and anxious. She was impatient with the time that it took for this writer to open pill packages and prepare the thiamine injection, constantly saying, "What is taking so long, I cannot take this" and appeared to be unable to remain still. In addition to agitation and anxiety she complained of lights being too bright and shielded her eyes with her hands. Rated her headache a 10/10 and was given Tylenol for pain. On recheck, she was in bed and appeared to be sleeping.

## 2013-11-18 NOTE — Progress Notes (Signed)
Patient ID: Christine JunesColleen Dickson, female   DOB: 10/08/1981, 33 y.o.   MRN: [id number] Pt is a 33 yr old caucasian female that came in for etoh detox. Pt stated that her last drink was earlier this morning. Pt started she doesn't know how much she drinks, she just drinks to get drunk. Pt stated she is currently living with her boyfriend and he is supportive of her. Pt stated her longest run of sobriety was 68 days. Pt stated she struggles with her depression and the way of dealing with it and drowning everything out is by drinking. Pt was introduced to unit, given a meal, and taken to room. Pt complains of headache 10/10, nurse notified. Pt denies si/hi/avh. No further signs of distress or other complaints at this time. Pt remains safe on unit.

## 2013-11-18 NOTE — ED Provider Notes (Signed)
CSN: [id number]     Arrival date & time 11/18/13  1141 History   First MD Initiated Contact with Patient 11/18/13 1210     Chief Complaint  Patient presents with  . Medical Clearance   (Consider location/radiation/quality/duration/timing/severity/associated sxs/prior Treatment) HPI Comments: Pt states that she is here because she wasn't detox from etoh:pt states that she he had a couple of 40 and some mouthwash yesterday:pt states that she just got out of 28 days with [facility] on January 1st:pt denies si/hi:pt states that the last time she had anything to drink was a couple of hours WUJ:[id number]:denies history of seizure related to etoh:pt states that she hasn't used anything else besides etoh  The history is provided by the patient. No language interpreter was used.    Past Medical History  Diagnosis Date  . Depression    History reviewed. No pertinent past surgical history. No family history on file. History  Substance Use Topics  . Smoking status: Current Every Day Smoker -- 1.00 packs/day for 10 years  . Smokeless tobacco: Not on file  . Alcohol Use: Yes     Comment: 1 pint twice weekly   OB History   Grav Para Term Preterm Abortions TAB SAB Ect Mult Living                 Review of Systems  Constitutional: Negative.   Respiratory: Negative.   Cardiovascular: Negative.     Allergies  Review of patient's allergies indicates no known allergies.  Home Medications   Current Outpatient Rx  Name  Route  Sig  Dispense  Refill  . EXPIRED: citalopram (CELEXA) 20 MG tablet   Oral   Take 1 tablet (20 mg total) by mouth daily. For depression/anxiety   30 tablet   0   . traZODone (DESYREL) 100 MG tablet   Oral   Take 100 mg by mouth at bedtime.          BP 112/64  Pulse 113  Temp(Src) 98.1 F (36.7 C) (Oral)  Resp 22  Ht 5\' 9"  (1.753 m)  Wt 140 lb (63.504 kg)  BMI 20.67 kg/m2  SpO2 96%  LMP 10/22/2013 Physical Exam  Nursing note and vitals reviewed. Constitutional:  She is oriented to person, place, and time. She appears well-developed and well-nourished.  HENT:  Head: Normocephalic and atraumatic.  Cardiovascular: Normal rate and regular rhythm.   Pulmonary/Chest: Effort normal and breath sounds normal.  Abdominal: Soft. Bowel sounds are normal.  Musculoskeletal: Normal range of motion.  Neurological: She is alert and oriented to person, place, and time.  Skin: Skin is warm and dry.  Psychiatric: She has a normal mood and affect.    ED Course  Procedures (including critical care time) Labs Review Labs Reviewed  COMPREHENSIVE METABOLIC PANEL - Abnormal; Notable for the following:    Total Bilirubin <0.2 (*)    All other components within normal limits  ETHANOL - Abnormal; Notable for the following:    Alcohol, Ethyl (B) 308 (*)    All other components within normal limits  SALICYLATE LEVEL - Abnormal; Notable for the following:    Salicylate Lvl 2.2 (*)    All other components within normal limits  ACETAMINOPHEN LEVEL  CBC  URINE RAPID DRUG SCREEN (HOSP PERFORMED)  POCT PREGNANCY, URINE   Imaging Review No results found.  EKG Interpretation   None       MDM   1. Alcohol abuse    [facility] with  Dr. Dub Mikes accepting    Teressa Lower, NP 11/18/13 [phone]

## 2013-11-18 NOTE — ED Notes (Signed)
Pt reports here for detox from ETOH use. Last use was a few hours ago, pt had 24 pack of beer. Pt lethargic in triage, but easily to awaken.

## 2013-11-18 NOTE — ED Notes (Signed)
Requesting detox from alcohol, states drinks as much as she is able to get her hands on, beer, wine, liquor. Last detox was in feb '14.  Denies SI, HI.  Pt very calm, cooperative, pleasant

## 2013-11-18 NOTE — ED Notes (Signed)
Telepsych in progress. 

## 2013-11-18 NOTE — Tx Team (Signed)
Initial Interdisciplinary Treatment Plan  PATIENT STRENGTHS: (choose at least two) Ability for insight Average or above average intelligence Communication skills Supportive family/friends  PATIENT STRESSORS: Financial difficulties Substance abuse   PROBLEM LIST: Problem List/Patient Goals Date to be addressed Date deferred Reason deferred Estimated date of resolution  etoh abuse       Poor coping skills      depression                                           DISCHARGE CRITERIA:  Ability to meet basic life and health needs Improved stabilization in mood, thinking, and/or behavior Motivation to continue treatment in a less acute level of care Verbal commitment to aftercare and medication compliance Withdrawal symptoms are absent or subacute and managed without 24-hour nursing intervention  PRELIMINARY DISCHARGE PLAN: Attend aftercare/continuing care group Attend 12-step recovery group Outpatient therapy Return to previous living arrangement  PATIENT/FAMIILY INVOLVEMENT: This treatment plan has been presented to and reviewed with the patient, Christine Dickson.  The patient and family have been given the opportunity to ask questions and make suggestions.  Heriberto Antiguaerry, Janneth Krasner M 11/18/2013, 6:32 PM

## 2013-11-18 NOTE — ED Notes (Signed)
Valuables given to security, belongings placed in locker. See inventory of personal belongings form for inventory of items.

## 2013-11-19 DIAGNOSIS — F1994 Other psychoactive substance use, unspecified with psychoactive substance-induced mood disorder: Secondary | ICD-10-CM

## 2013-11-19 DIAGNOSIS — F1020 Alcohol dependence, uncomplicated: Principal | ICD-10-CM

## 2013-11-19 MED ORDER — IBUPROFEN 200 MG PO TABS
200.0000 mg | ORAL_TABLET | Freq: Four times a day (QID) | ORAL | Status: DC | PRN
  Administered 2013-11-20: 400 mg via ORAL
  Filled 2013-11-19: qty 2

## 2013-11-19 MED ORDER — TRAZODONE HCL 100 MG PO TABS
100.0000 mg | ORAL_TABLET | Freq: Every day | ORAL | Status: DC
  Administered 2013-11-20 (×2): 100 mg via ORAL
  Filled 2013-11-19 (×3): qty 1

## 2013-11-19 MED ORDER — NICOTINE 14 MG/24HR TD PT24
14.0000 mg | MEDICATED_PATCH | Freq: Every day | TRANSDERMAL | Status: DC
  Administered 2013-11-19 – 2013-11-20 (×2): 14 mg via TRANSDERMAL
  Filled 2013-11-19 (×5): qty 1

## 2013-11-19 MED ORDER — CITALOPRAM HYDROBROMIDE 20 MG PO TABS
ORAL_TABLET | ORAL | Status: AC
  Filled 2013-11-19: qty 2

## 2013-11-19 MED ORDER — CITALOPRAM HYDROBROMIDE 40 MG PO TABS
40.0000 mg | ORAL_TABLET | Freq: Every day | ORAL | Status: DC
  Administered 2013-11-19 – 2013-11-20 (×2): 40 mg via ORAL
  Filled 2013-11-19 (×3): qty 1

## 2013-11-19 NOTE — Progress Notes (Addendum)
Patient ID: Christine JunesColleen Dickson, female   DOB: 12/30/1980, 33 y.o.   MRN: [id number] She has been up and to groups interacting with peers and staff. Self inventory: depression 8, hopelessness 6, withdrawals of chilling and sedation. She has been up for meals and part of one grout. She is c/o being very tired and weak just wants to rest.

## 2013-11-19 NOTE — [facility] Suicide Risk Assessment (Signed)
[facility] INPATIENT: Family/Significant Other Suicide Prevention Education  Suicide Prevention Education:  Education Completed; No one has been identified by the patient as the family member/significant other with whom the patient will be residing, and identified as the person(s) who will aid the patient in the event of a mental health crisis (suicidal ideations/suicide attempt).   Pt did not c/o SI at admission, nor have they endorsed SI during their stay here. SPE not required. SPI pamphlet provided to pt and she was encouraged to share information with support network, ask questions, and talk about any concerns.   The Sherwin-WilliamsHeather Smart, LCSWA 11/19/2013 12:57 PM

## 2013-11-19 NOTE — [facility] Group Notes (Signed)
[facility] LCS[facility] Aftercare Discharge Planning Group Note   11/19/2013 9:34 AM  Participation Quality:  DID NOT ATTEND-pt in bed asleep   Smart, HeatherLCSWA

## 2013-11-19 NOTE — Progress Notes (Signed)
Adult Psychoeducational Group Note  Date:  11/19/2013 Time:  10:00AM Group Topic/Focus:  Wellness Toolbox:   The focus of this group is to discuss various aspects of wellness, balancing those aspects and exploring ways to increase the ability to experience wellness.  Patients will create a wellness toolbox for use upon discharge.  Participation Level:  Did Not Attend   Additional Comments: Pt. Didn't attend group.    Bing PlumeScott, Sophiya Morello D 11/19/2013, 10:51 AM
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
## 2013-11-19 NOTE — Progress Notes (Signed)
Recreation Therapy Notes  Date: 01.05.2015 Time: 3:00pm Location: [address] Dayroom   Group Topic: Wellness  Goal Area(s) Addresses:  Patient will define components of whole wellness. Patient will verbalize benefit of whole wellness.  Behavioral Response: Did not attend.  Marykay Lexenise L [place], LRT/CTRS  Khoen Genet L 11/19/2013 5:05 PM

## 2013-11-19 NOTE — Tx Team (Signed)
Interdisciplinary Treatment Plan Update (Adult)  Date: 11/19/2013   Time Reviewed: 11:00 AM  Progress in Treatment:  Attending groups: No.  Participating in groups:  No.  Taking medication as prescribed: Yes  Tolerating medication: Yes  Family/Significant othe contact made: No. SPE not required for this pt.  Patient understands diagnosis: Yes, AEB seeking treatment for ETOH detox and mood stabilization.  Discussing patient identified problems/goals with staff: Yes  Medical problems stabilized or resolved: Yes  Denies suicidal/homicidal ideation: Yes during admission/self report.  Patient has not harmed self or Others: Yes  New problem(s) identified: pt not attending groups at this time.  Discharge Plan or Barriers: Pt currently not attending d/c planning group. CSW assessing for appropriate referrals and will meet with pt this afternoon to complete PSA.  Additional comments:  Pt is a 33 yr old caucasian female that came in for etoh detox. Pt stated that her last drink was earlier this morning. Pt started she doesn't know how much she drinks, she just drinks to get drunk. Pt stated she is currently living with her boyfriend and he is supportive of her. Pt stated her longest run of sobriety was 68 days. Pt stated she struggles with her depression and the way of dealing with it and drowning everything out is by drinking. Pt was introduced to unit, given a meal, and taken to room. Pt complains of headache 10/10, nurse notified. Pt denies si/hi/avh. No further signs of distress or other complaints at this time. Pt remains safe on unit.     Reason for Continuation of Hospitalization: Librium taper-withdrawals Mood stabilization  Estimated length of stay: 2-4 days  For review of initial/current patient goals, please see plan of care.  Attendees:  Patient:    Family:    Physician:    Nursing:    Clinical Social Worker [employer] Sher[employer]-WilliamsHeather Smart, LCSWA  11/19/2013 11:00 AM   Other: Aggie N. PA 11/19/2013 11:00 AM    Other:    Other:    Other:    Scribe for Treatment Team:  [employer] Sher[employer]-WilliamsHeather Smart LCSWA 11/19/2013 11:00 AM

## 2013-11-19 NOTE — ED Provider Notes (Signed)
Medical screening examination/treatment/procedure(s) were performed by non-physician practitioner and as supervising physician I was immediately available for consultation/collaboration.  EKG Interpretation   None         Gwyneth SproutWhitney Keron Koffman, MD 11/19/13 1546

## 2013-11-19 NOTE — [facility] Group Notes (Signed)
[facility] LCSW Group Therapy  11/19/2013 3:43 PM  Type of Therapy:  Group Therapy  Participation Level:  Did Not Attend-pt left group after five minutes and did not participate. Pt presnts as irritable and depressed.   Smart, HeatherLCSWA  11/19/2013, 3:43 PM

## 2013-11-19 NOTE — Progress Notes (Signed)
Adult Psychoeducational Group Note  Date:  11/19/2013 Time: 2000  Group Topic/Focus:  AA   Participation Level:  Did Not Attend  Participation Quality:    Affect:    Cognitive:    Insight:   Engagement in Group:    Modes of Intervention:    Additional Comments:   Humberto SealsWhitaker, Riot Waterworth Monique 11/19/2013, 11:14 PM

## 2013-11-20 MED ORDER — CITALOPRAM HYDROBROMIDE 40 MG PO TABS: 40.0000 mg | ORAL_TABLET | Freq: Every day | ORAL | Status: AC

## 2013-11-20 MED ORDER — CITALOPRAM HYDROBROMIDE 40 MG PO TABS: 40.0000 mg | ORAL_TABLET | Freq: Every day | ORAL | Status: DC

## 2013-11-20 MED ORDER — TRAZODONE HCL 100 MG PO TABS: 100.0000 mg | ORAL_TABLET | Freq: Every day | ORAL | Status: DC

## 2013-11-20 MED ORDER — BACITRACIN-NEOMYCIN-POLYMYXIN OINTMENT TUBE
TOPICAL_OINTMENT | CUTANEOUS | Status: DC | PRN
  Filled 2013-11-20: qty 15

## 2013-11-20 MED ORDER — TRAZODONE HCL 100 MG PO TABS: 100.0000 mg | ORAL_TABLET | Freq: Every day | ORAL | Status: AC

## 2013-11-20 NOTE — [facility] Group Notes (Signed)
[facility] LCSW Group Therapy  11/20/2013 4:12 PM  Type of Therapy:  Group Therapy  Participation Level:  Active  Participation Quality:  Attentive  Affect:  Appropriate  Cognitive:  Alert and Oriented  Insight:  Engaged  Engagement in Therapy:  Engaged  Modes of Intervention:  Discussion, Education, Exploration, Problem-solving, Socialization and Support  Summary of Progress/Problems: MHA Speaker came to talk about his personal journey with substance abuse and addiction. The pt processed ways by which to relate to the speaker. MHA speaker provided handouts and educational information pertaining to groups and services offered by the [facility]. Jill SideColleen was attentive and engaged throughout today's therapy group. She actively listened as speaker shared his personal story regarding MI and SA. Jelicia asked about the speaker's experience with bipolar disorder and substance abuse and how to differentiate between symptoms. Jill SideColleen shows progress in the group setting AEB her active participation during Q and A portion of group and her ability to follow along as speaker reviewed various services offered by [facility].    Smart, HeatherLCSWA  11/20/2013, 4:12 PM

## 2013-11-20 NOTE — [facility] Suicide Risk Assessment (Signed)
Suicide Risk Assessment  Discharge Assessment     Demographic Factors:  Caucasian  Mental Status Per Nursing Assessment::   On Admission:  NA  Current Mental Status by Physician: In full contact with reality. There are no suicidal ideas, plans or intent. Her mood is euthymic, her affect is appropriate. There are no active S/S of withdrawal. [facility]. She states she really needs the help. Plans to go to a half way house afterwards.   Loss Factors: NA  Historical Factors: NA  Risk Reduction Factors:   wanting to do better  Continued Clinical Symptoms:  Alcohol/Substance Abuse/Dependencies  Cognitive Features That Contribute To Risk:  Closed-mindedness Polarized thinking Thought constriction (tunnel vision)    Suicide Risk:  Minimal: No identifiable suicidal ideation.  Patients presenting with no risk factors but with morbid ruminations; may be classified as minimal risk based on the severity of the depressive symptoms  Discharge Diagnoses:   AXIS I:  Alcohol dependence/substance induced mood disorder AXIS II:  Deferred AXIS III:   Past Medical History  Diagnosis Date  . Depression    AXIS IV:  other psychosocial or environmental problems AXIS V:  61-70 mild symptoms  Plan Of Care/Follow-up recommendations:  Activity:  as tolerated Diet:  [facility] Is patient on multiple antipsychotic therapies at discharge:  No   Has Patient had three or more failed trials of antipsychotic monotherapy by history:  No  Recommended Plan for Multiple Antipsychotic Therapies: NA  Yaquelin Langelier A 11/20/2013, 5:56 PM

## 2013-11-20 NOTE — Progress Notes (Signed)
[facility] :  Will you be returning to the same living situation after discharge: No.[facility] At discharge, do you have transportation home?:Yes,  bus pass and map in chart.  Do you have the ability to pay for your medications:Yes,  mental health  Release of information consent forms completed and submitted to Medical Records by CSW.    Follow-up Information   Follow up with [facility]. (Walk in between 8am-9am Monday through Friday for hospital followup. )    Contact information:   [address] [place], [place] [address] Phone: [phone] Fax: [phone]      Follow up with [facility] On 11/21/2013. ([facility] [address] by 7:30AM on this date. [facility] will pick you up to transport you to facility. Please call Trey PaulaJeff when you arrive. )    Contact information:   [address] [place], [place] [address] Phone: [phone] Fax: [phone]      Patient denies SI/HI:   Yes,  during admission/group/self report.    Safety Planning and Suicide Prevention discussed:  Yes,  SPE not required for this pt. SPI pamphlet provided to pt and she was encouraged to share information with support network.   Smart, HeatherLCSWA  11/20/2013, 3:32 PM

## 2013-11-20 NOTE — [facility] Suicide Risk Assessment (Signed)
Suicide Risk Assessment  Admission Assessment     Nursing information obtained from:  Patient Demographic factors:  Caucasian;Unemployed Current Mental Status:  NA Loss Factors:  NA Historical Factors:  Prior suicide attempts;Family history of mental illness or substance abuse Risk Reduction Factors:  Living with another person, especially a relative  CLINICAL FACTORS:   Depression:   Comorbid alcohol abuse/dependence Alcohol/Substance Abuse/Dependencies  COGNITIVE FEATURES THAT CONTRIBUTE TO RISK:  Closed-mindedness Polarized thinking Thought constriction (tunnel vision)    SUICIDE RISK:   Moderate:    PLAN OF CARE: Supportive approach/coping skills/identify detox needs                               Reassess and address the co morbidities  I certify that inpatient services furnished can reasonably be expected to improve the patient's condition.  Keishla Oyer A 11/20/2013, 12:34 PM
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
## 2013-11-20 NOTE — Progress Notes (Signed)
[facility] MD Progress Note  11/20/2013 12:25 PM Christine Dickson  MRN:  [id number] Subjective:  Christine Dickson endorses that she is "an addict" and that relapse comes with the territory. States she spend the 30 days at [facility], did really well there came out jan 1, and shortly after that started having cravings, started obsessing about it and relapsed. States she was not spiritually ready. States she could have called the sponsor, called other people of her support system but chose not to do it. She would [facility] to [facility] or [facility] Diagnosis:   DSM5: Schizophrenia Disorders:  Denies Obsessive-Compulsive Disorders:  Denies Trauma-Stressor Disorders:  Denies Substance/Addictive Disorders:  Alcohol Related Disorder - Severe (303.90) Depressive Disorders:  Major Depressive Disorder - Moderate (296.22)  Axis I: Substance Induced Mood Disorder  ADL's:  Intact  Sleep: Fair  Appetite:  Fair  Suicidal Ideation:  Plan:  denies Intent:  denies Means:  denies Homicidal Ideation:  Plan:  denies Intent:  denies Means:  denies AEB (as evidenced by):  Psychiatric Specialty Exam: Review of Systems  Constitutional: Negative.   HENT: Negative.   Eyes: Negative.   Respiratory: Negative.   Cardiovascular: Negative.   Gastrointestinal: Negative.   Genitourinary: Negative.   Musculoskeletal: Negative.   Skin: Negative.   Neurological: Negative.   Endo/Heme/Allergies: Negative.   Psychiatric/Behavioral: Positive for depression and substance abuse. The patient is nervous/anxious.     Blood pressure 113/77, pulse 113, temperature 98.1 F (36.7 C), temperature source Oral, resp. rate 16, height '5\' 9"'  (1.753 m), weight 62.596 kg (138 lb), last menstrual period 11/18/2013.Body mass index is 20.37 kg/(m^2).  General Appearance: Fairly Groomed  Engineer, water::  Fair  Speech:  Clear and Coherent  Volume:  Decreased  Mood:  Anxious and Depressed  Affect:  anxious, sad, worried  Thought Process:  Coherent  and Goal Directed  Orientation:  Full (Time, Place, and Person)  Thought Content:  worries, concerns  Suicidal Thoughts:  No  Homicidal Thoughts:  No  Memory:  Immediate;   Fair Recent;   Fair Remote;   Fair  Judgement:  Fair  Insight:  Present  Psychomotor Activity:  Restlessness  Concentration:  Fair  Recall:  Fair  Akathisia:  No  Handed:    AIMS (if indicated):     Assets:  Desire for Improvement  Sleep:  Number of Hours: 6.25   Current Medications: Current Facility-Administered Medications  Medication Dose Route Frequency Provider Last Rate Last Dose  . acetaminophen (TYLENOL) tablet 650 mg  650 mg Oral Q6H PRN Waylan Boga, NP   650 mg at 11/19/13 1151  . alum & mag hydroxide-simeth (MAALOX/MYLANTA) 200-200-20 MG/5ML suspension 30 mL  30 mL Oral Q4H PRN Waylan Boga, NP      . chlordiazePOXIDE (LIBRIUM) capsule 25 mg  25 mg Oral Q6H PRN Waylan Boga, NP      . chlordiazePOXIDE (LIBRIUM) capsule 25 mg  25 mg Oral TID Waylan Boga, NP   25 mg at 11/20/13 1155   Followed by  . [START ON 11/21/2013] chlordiazePOXIDE (LIBRIUM) capsule 25 mg  25 mg Oral BH-qamhs Waylan Boga, NP       Followed by  . [START ON 11/22/2013] chlordiazePOXIDE (LIBRIUM) capsule 25 mg  25 mg Oral Daily Waylan Boga, NP      . citalopram (CELEXA) tablet 40 mg  40 mg Oral Daily Encarnacion Slates, NP   40 mg at 11/20/13 0758  . hydrOXYzine (ATARAX/VISTARIL) tablet 25 mg  25 mg Oral  Q6H PRN Waylan Boga, NP   25 mg at 11/18/13 2352  . ibuprofen (ADVIL,MOTRIN) tablet 200-400 mg  200-400 mg Oral Q6H PRN Encarnacion Slates, NP      . loperamide (IMODIUM) capsule 2-4 mg  2-4 mg Oral PRN Waylan Boga, NP      . magnesium hydroxide (MILK OF MAGNESIA) suspension 30 mL  30 mL Oral Daily PRN Waylan Boga, NP      . multivitamin with minerals tablet 1 tablet  1 tablet Oral Daily Waylan Boga, NP   1 tablet at 11/20/13 0758  . neomycin-bacitracin-polymyxin (NEOSPORIN) ointment   Topical PRN Laverle Hobby, PA-C      . nicotine  (NICODERM CQ - dosed in mg/24 hours) patch 14 mg  14 mg Transdermal Daily Encarnacion Slates, NP   14 mg at 11/20/13 0758  . ondansetron (ZOFRAN-ODT) disintegrating tablet 4 mg  4 mg Oral Q6H PRN Waylan Boga, NP      . thiamine (VITAMIN B-1) tablet 100 mg  100 mg Oral Daily Waylan Boga, NP   100 mg at 11/20/13 0758  . traZODone (DESYREL) tablet 100 mg  100 mg Oral QHS Encarnacion Slates, NP   100 mg at 11/20/13 3329    Lab Results:  Results for orders placed during the hospital encounter of 11/18/13 (from the past 48 hour(s))  ACETAMINOPHEN LEVEL     Status: None   Collection Time    11/18/13  1:08 PM      Result Value Range   Acetaminophen (Tylenol), Serum <15.0  10 - 30 ug/mL   Comment:            THERAPEUTIC CONCENTRATIONS VARY     SIGNIFICANTLY. A RANGE OF 10-30     ug/mL MAY BE AN EFFECTIVE     CONCENTRATION FOR MANY PATIENTS.     HOWEVER, SOME ARE BEST TREATED     AT CONCENTRATIONS OUTSIDE THIS     RANGE.     ACETAMINOPHEN CONCENTRATIONS     >150 ug/mL AT 4 HOURS AFTER     INGESTION AND >50 ug/mL AT 12     HOURS AFTER INGESTION ARE     OFTEN ASSOCIATED WITH TOXIC     REACTIONS.  CBC     Status: None   Collection Time    11/18/13  1:08 PM      Result Value Range   WBC 9.0  4.0 - 10.5 K/uL   RBC 4.21  3.87 - 5.11 MIL/uL   Hemoglobin 13.1  12.0 - 15.0 g/dL   HCT 38.0  36.0 - 46.0 %   MCV 90.3  78.0 - 100.0 fL   MCH 31.1  26.0 - 34.0 pg   MCHC 34.5  30.0 - 36.0 g/dL   RDW 12.4  11.5 - 15.5 %   Platelets 319  150 - 400 K/uL  COMPREHENSIVE METABOLIC PANEL     Status: Abnormal   Collection Time    11/18/13  1:08 PM      Result Value Range   Sodium 144  137 - 147 mEq/L   Comment: Please note change in reference range.   Potassium 4.0  3.7 - 5.3 mEq/L   Comment: Please note change in reference range.   Chloride 107  96 - 112 mEq/L   CO2 25  19 - 32 mEq/L   Glucose, Bld 85  70 - 99 mg/dL   BUN 6  6 - 23 mg/dL   Creatinine, Ser 0.71  0.50 - 1.10 mg/dL   Calcium 8.6  8.4 - 10.5  mg/dL   Total Protein 7.0  6.0 - 8.3 g/dL   Albumin 4.0  3.5 - 5.2 g/dL   AST 17  0 - 37 U/L   ALT 13  0 - 35 U/L   Alkaline Phosphatase 45  39 - 117 U/L   Total Bilirubin <0.2 (*) 0.3 - 1.2 mg/dL   GFR calc non Af Amer >90  >90 mL/min   GFR calc Af Amer >90  >90 mL/min   Comment: (NOTE)     The eGFR has been calculated using the CKD EPI equation.     This calculation has not been validated in all clinical situations.     eGFR's persistently <90 mL/min signify possible Chronic Kidney     Disease.  ETHANOL     Status: Abnormal   Collection Time    11/18/13  1:08 PM      Result Value Range   Alcohol, Ethyl (B) 308 (*) 0 - 11 mg/dL   Comment:            LOWEST DETECTABLE LIMIT FOR     SERUM ALCOHOL IS 11 mg/dL     FOR MEDICAL PURPOSES ONLY  SALICYLATE LEVEL     Status: Abnormal   Collection Time    11/18/13  1:08 PM      Result Value Range   Salicylate Lvl 2.2 (*) 2.8 - 20.0 mg/dL    Physical Findings: AIMS:  , ,  ,  ,    CIWA:  CIWA-Ar Total: 2 COWS:     Treatment Plan Summary: Daily contact with patient to assess and evaluate symptoms and progress in treatment Medication management  Plan: Supportive approach/coping skills/relapse prevention           Reassess and address the co morbidities           Consider Campral or Naltrexone for cravings  Medical Decision Making Problem Points:  Review of psycho-social stressors (1) Data Points:  Review of medication regiment & side effects (2)  I certify that inpatient services furnished can reasonably be expected to improve the patient's condition.   Lakela Kuba A 11/20/2013, 12:25 PM

## 2013-11-20 NOTE — Progress Notes (Signed)
Recreation Therapy Notes  Animal-Assisted Activity/Therapy (AAA/T) Program Checklist/Progress Notes Patient Eligibility Criteria Checklist & Daily Group note for Rec Tx Intervention  Date: 01.06.2015 Time: 2:45pm [address]    AAA/T Program Assumption of Risk Form signed by Patient/ or Parent Legal Guardian yes  Patient is free of allergies or sever asthma yes  Patient reports no fear of animals yes  Patient reports no history of cruelty to animals yes   Patient understands his/her participation is voluntary yes  Patient washes hands before animal contact yes  Patient washes hands after animal contact yes  Behavioral Response: Appropriate, Attentive  Education: Hand Washing, Appropriate Animal Interaction   Education Outcome: Acknowledges understanding   Clinical Observations/Feedback: Patient interacted appropriately with therapy dog team and peers.    Marykay Lexenise L Adonis Yim, LRT/CTRS  Jearl KlinefelterBlanchfield, Jaclene Bartelt L 11/20/2013 4:48 PM
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
## 2013-11-20 NOTE — Progress Notes (Signed)
Patient ID: Christine JunesColleen Dickson, female   DOB: 02/01/1981, 33 y.o.   MRN: [id number] She has been up and to pat of the groups. Will participate in groups , Self inventory: depression 4, hopelessness 3 withdrawal agitation anger and anxiety.  She said that she was feeling better today.

## 2013-11-20 NOTE — Clinical Social Work Note (Signed)
CSW reviewed bus schedule with pt. Bus pass, bus schedules and directions [facility] get [facility] in chart for pt. She must d/c by 5:30AM [facility]. CN/MD/staff made aware.  Oasis Goehring Smart LCSWA 11/20/2013 3:59 PM

## 2013-11-20 NOTE — Progress Notes (Signed)
Adult Psychoeducational Group Note  Date:  11/20/2013 Time: 2000  Group Topic/Focus:  Wrap-Up Group:   The focus of this group is to help patients review their daily goal of treatment and discuss progress on daily workbooks.  Participation Level:  Active  Participation Quality:  Appropriate, Attentive and Supportive  Affect:  Appropriate  Cognitive:  Alert, Appropriate and Oriented  Insight: Appropriate and Good  Engagement in Group:  Engaged  Modes of Intervention:  Discussion, Education, Exploration and Support  Additional Comments:  Pt shared her plans for discharge. States that a "structured environment" will help her "get back to life".   Humberto SealsWhitaker, Christine Dickson Monique 11/20/2013, 9:27 PM

## 2013-11-20 NOTE — Progress Notes (Signed)
D   Pt was in bed the first part of the shift and did not receive her medications   She got up around 1230 requesting medications   Pt complained of a small area on her chin where she got drunk and fell and skinned area  She requested some neosporin  Pt appears dischelved and anxious   She reports minimal signs of withdrawal  A   Verbal support given  Received orders for neosporin and will provide for pt in the am as per our discussion   Encouraged pt to wash the area with soap and warm water   Medications administered and effectiveness monitored    Q 15 min checks R   Pt safe at present and verbalized understanding

## 2013-11-21 ENCOUNTER — Emergency Department (HOSPITAL_COMMUNITY): Admission: EM | Admit: 2013-11-21 | Discharge: 2013-11-21 | Discharge status: Against Medical Advice | Payer: Self-pay

## 2013-11-21 NOTE — Progress Notes (Signed)
Pt was discharged  Left with all of her belongings and follow up information  She verbalized understanding of all discharge instructions  She denies suicidal and homicidal ideation  Expressed gratitude for staff and all that was done for her

## 2013-11-21 NOTE — Progress Notes (Signed)
D   Pt is pleasant and appropriate   She brightens on approach and endorses some anxiety about her discharge tomorrow and her further treatment   She reports she feels much better and has no signs or symptoms of withdrawal A   Verbal support given    Medications administered and effectiveness monitored   Q 15 min checks R   Pt safe at present

## 2013-11-22 ENCOUNTER — Encounter (HOSPITAL_COMMUNITY): Payer: Self-pay | Admitting: Emergency Medicine

## 2013-11-22 ENCOUNTER — Emergency Department (HOSPITAL_COMMUNITY)
Admission: EM | Admit: 2013-11-22 | Discharge: 2013-11-22 | Disposition: A | Discharge status: Home / Self Care | Payer: No Typology Code available for payment source | Attending: Emergency Medicine | Admitting: Emergency Medicine

## 2013-11-22 DIAGNOSIS — F17200 Nicotine dependence, unspecified, uncomplicated: Secondary | ICD-10-CM | POA: Insufficient documentation

## 2013-11-22 DIAGNOSIS — F10229 Alcohol dependence with intoxication, unspecified: Secondary | ICD-10-CM | POA: Insufficient documentation

## 2013-11-22 DIAGNOSIS — F329 Major depressive disorder, single episode, unspecified: Secondary | ICD-10-CM | POA: Insufficient documentation

## 2013-11-22 DIAGNOSIS — F10929 Alcohol use, unspecified with intoxication, unspecified: Secondary | ICD-10-CM

## 2013-11-22 DIAGNOSIS — F1010 Alcohol abuse, uncomplicated: Secondary | ICD-10-CM

## 2013-11-22 DIAGNOSIS — Z79899 Other long term (current) drug therapy: Secondary | ICD-10-CM | POA: Insufficient documentation

## 2013-11-22 DIAGNOSIS — F1994 Other psychoactive substance use, unspecified with psychoactive substance-induced mood disorder: Secondary | ICD-10-CM

## 2013-11-22 DIAGNOSIS — F1020 Alcohol dependence, uncomplicated: Secondary | ICD-10-CM

## 2013-11-22 DIAGNOSIS — F3289 Other specified depressive episodes: Secondary | ICD-10-CM | POA: Insufficient documentation

## 2013-11-22 LAB — CBC WITH DIFFERENTIAL/PLATELET
BASOS ABS: 0.1 10*3/uL (ref 0.0–0.1)
Basophils Relative: 1 % (ref 0–1)
EOS ABS: 0.1 10*3/uL (ref 0.0–0.7)
EOS PCT: 1 % (ref 0–5)
HEMATOCRIT: 38.5 % (ref 36.0–46.0)
Hemoglobin: 13.5 g/dL (ref 12.0–15.0)
LYMPHS PCT: 20 % (ref 12–46)
Lymphs Abs: 2.7 10*3/uL (ref 0.7–4.0)
MCH: 31.1 pg (ref 26.0–34.0)
MCHC: 35.1 g/dL (ref 30.0–36.0)
MCV: 88.7 fL (ref 78.0–100.0)
MONO ABS: 0.6 10*3/uL (ref 0.1–1.0)
Monocytes Relative: 4 % (ref 3–12)
Neutro Abs: 10.4 10*3/uL — ABNORMAL HIGH (ref 1.7–7.7)
Neutrophils Relative %: 75 % (ref 43–77)
Platelets: 360 10*3/uL (ref 150–400)
RBC: 4.34 MIL/uL (ref 3.87–5.11)
RDW: 12.1 % (ref 11.5–15.5)
WBC: 13.9 10*3/uL — AB (ref 4.0–10.5)

## 2013-11-22 LAB — RAPID URINE DRUG SCREEN, HOSP PERFORMED
Amphetamines: NOT DETECTED
BARBITURATES: NOT DETECTED
Benzodiazepines: POSITIVE — AB
COCAINE: NOT DETECTED
OPIATES: NOT DETECTED
Tetrahydrocannabinol: NOT DETECTED

## 2013-11-22 LAB — COMPREHENSIVE METABOLIC PANEL
ALT: 16 U/L (ref 0–35)
AST: 23 U/L (ref 0–37)
Albumin: 4.6 g/dL (ref 3.5–5.2)
Alkaline Phosphatase: 50 U/L (ref 39–117)
BUN: 6 mg/dL (ref 6–23)
CALCIUM: 9 mg/dL (ref 8.4–10.5)
CO2: 24 meq/L (ref 19–32)
CREATININE: 0.52 mg/dL (ref 0.50–1.10)
Chloride: 98 mEq/L (ref 96–112)
GFR calc non Af Amer: >90 mL/min (ref >90)
Glucose, Bld: 87 mg/dL (ref 70–99)
Potassium: 4.2 mEq/L (ref 3.7–5.3)
Sodium: 139 mEq/L (ref 137–147)
TOTAL PROTEIN: 7.5 g/dL (ref 6.0–8.3)
Total Bilirubin: <0.2 mg/dL — ABNORMAL LOW (ref 0.3–1.2)

## 2013-11-22 LAB — URINALYSIS, ROUTINE W REFLEX MICROSCOPIC
Bilirubin Urine: NEGATIVE
Glucose, UA: NEGATIVE mg/dL
Hgb urine dipstick: NEGATIVE
Ketones, ur: NEGATIVE mg/dL
LEUKOCYTES UA: NEGATIVE
NITRITE: NEGATIVE
PH: 5.5 (ref 5.0–8.0)
Protein, ur: NEGATIVE mg/dL
SPECIFIC GRAVITY, URINE: 1.005 (ref 1.005–1.030)
UROBILINOGEN UA: 0.2 mg/dL (ref 0.0–1.0)

## 2013-11-22 LAB — ETHANOL: Alcohol, Ethyl (B): 378 mg/dL — ABNORMAL HIGH (ref 0–11)

## 2013-11-22 MED ORDER — SODIUM CHLORIDE 0.9 % IV BOLUS (SEPSIS)
1000.0000 mL | Freq: Once | INTRAVENOUS | Status: AC
  Administered 2013-11-22: 1000 mL via INTRAVENOUS

## 2013-11-22 MED ORDER — LORAZEPAM 1 MG PO TABS: 0.0000 mg | ORAL_TABLET | Freq: Four times a day (QID) | ORAL | Status: DC

## 2013-11-22 MED ORDER — LORAZEPAM 1 MG PO TABS: 0.0000 mg | ORAL_TABLET | Freq: Two times a day (BID) | ORAL | Status: DC

## 2013-11-22 MED ORDER — CITALOPRAM HYDROBROMIDE 40 MG PO TABS
40.0000 mg | ORAL_TABLET | Freq: Every day | ORAL | Status: DC
  Filled 2013-11-22: qty 1

## 2013-11-22 NOTE — ED Notes (Signed)
Pt has abrasion to chin; unable to remember when she fell; c collar applied on arrival

## 2013-11-22 NOTE — Consult Note (Signed)
Note reviewed and agreed with  

## 2013-11-22 NOTE — Progress Notes (Addendum)
Per, Dr. Ladona Ridgelaylor the patient is psychiatrically stable for discharge.  Writer provided the patient with outpatient substance abuse resources for inpatient and outpatient services.

## 2013-11-22 NOTE — Consult Note (Signed)
[facility] Face-to-Face Psychiatry Consult   Reason for Consult:  Alcohol detox Referring Physician:  EDP  Christine Dickson is an 33 y.o. female.  Assessment: AXIS I:  Alcohol Abuse and Substance Induced Mood Disorder AXIS II:  Deferred AXIS III:   Past Medical History  Diagnosis Date  . Depression    AXIS IV:  other psychosocial or environmental problems and problems related to social environment AXIS V:  51-60 moderate symptoms  Plan:  No evidence of imminent risk to self or others at present.   Patient does not meet criteria for psychiatric inpatient admission. Supportive therapy provided about ongoing stressors. Discussed crisis plan, support from social network, calling 911, coming to the Emergency Department, and calling Suicide Hotline.  Subjective:   Christine Dickson is a 33 y.o. female.  HPI:  Patient states that she was discharged form detox a few days ago.  Patient states that she wants help with detox.  Patient denies suicidal/homicidal ideation, psychosis, and paranoia.  Patient states that she want to stop drinking.    Patient was discharged from [facility] yesterday after detox.  Patient is back again today requesting assistance with detox.  Patient is showing no signs of withdrawal.    Past Psychiatric History: Past Medical History  Diagnosis Date  . Depression     reports that she has been smoking Cigarettes.  She has a 10 pack-year smoking history. She does not have any smokeless tobacco history on file. She reports that she drinks about 22.9 ounces of alcohol per week. She reports that she does not use illicit drugs. No family history on file.         Allergies:  No Known Allergies  ACT Assessment Complete:  No:   Past Psychiatric History: Diagnosis:  Alcohol abuse, Substance induced mood disorder  Hospitalizations:  Yes  Outpatient Care:  [facility]  Substance Abuse Care:  Yes  Self-Mutilation:  Yes  Suicidal Attempts:  Past history  Homicidal Behaviors:   Denies   Violent Behaviors:  Denies   Place of Residence:  [place] Marital Status:   Employed/Unemployed:  Unemployed Education:   Family Supports:  Denies Objective: Blood pressure 100/67, pulse 92, temperature 98 F (36.7 C), temperature source Oral, resp. rate 18, height 5' 9" (1.753 m), weight 63.504 kg (140 lb), last menstrual period 11/18/2013, SpO2 95.00%.Body mass index is 20.67 kg/(m^2). Results for orders placed during the hospital encounter of 11/22/13 (from the past 72 hour(s))  ETHANOL     Status: Abnormal   Collection Time    11/22/13  3:30 AM      Result Value Range   Alcohol, Ethyl (B) 378 (*) 0 - 11 mg/dL   Comment:            LOWEST DETECTABLE LIMIT FOR     SERUM ALCOHOL IS 11 mg/dL     FOR MEDICAL PURPOSES ONLY  CBC WITH DIFFERENTIAL     Status: Abnormal   Collection Time    11/22/13  3:30 AM      Result Value Range   WBC 13.9 (*) 4.0 - 10.5 K/uL   RBC 4.34  3.87 - 5.11 MIL/uL   Hemoglobin 13.5  12.0 - 15.0 g/dL   HCT 38.5  36.0 - 46.0 %   MCV 88.7  78.0 - 100.0 fL   MCH 31.1  26.0 - 34.0 pg   MCHC 35.1  30.0 - 36.0 g/dL   RDW 12.1  11.5 - 15.5 %   Platelets 360  150 -  400 K/uL   Neutrophils Relative % 75  43 - 77 %   Neutro Abs 10.4 (*) 1.7 - 7.7 K/uL   Lymphocytes Relative 20  12 - 46 %   Lymphs Abs 2.7  0.7 - 4.0 K/uL   Monocytes Relative 4  3 - 12 %   Monocytes Absolute 0.6  0.1 - 1.0 K/uL   Eosinophils Relative 1  0 - 5 %   Eosinophils Absolute 0.1  0.0 - 0.7 K/uL   Basophils Relative 1  0 - 1 %   Basophils Absolute 0.1  0.0 - 0.1 K/uL  COMPREHENSIVE METABOLIC PANEL     Status: Abnormal   Collection Time    11/22/13  3:30 AM      Result Value Range   Sodium 139  137 - 147 mEq/L   Potassium 4.2  3.7 - 5.3 mEq/L   Chloride 98  96 - 112 mEq/L   CO2 24  19 - 32 mEq/L   Glucose, Bld 87  70 - 99 mg/dL   BUN 6  6 - 23 mg/dL   Creatinine, Ser 0.52  0.50 - 1.10 mg/dL   Calcium 9.0  8.4 - 10.5 mg/dL   Total Protein 7.5  6.0 - 8.3 g/dL    Albumin 4.6  3.5 - 5.2 g/dL   AST 23  0 - 37 U/L   ALT 16  0 - 35 U/L   Alkaline Phosphatase 50  39 - 117 U/L   Total Bilirubin <0.2 (*) 0.3 - 1.2 mg/dL   GFR calc non Af Amer >90  >90 mL/min   GFR calc Af Amer >90  >90 mL/min   Comment: (NOTE)     The eGFR has been calculated using the CKD EPI equation.     This calculation has not been validated in all clinical situations.     eGFR's persistently <90 mL/min signify possible Chronic Kidney     Disease.  URINALYSIS, ROUTINE W REFLEX MICROSCOPIC     Status: None   Collection Time    11/22/13  5:28 AM      Result Value Range   Color, Urine YELLOW  YELLOW   APPearance CLEAR  CLEAR   Specific Gravity, Urine 1.005  1.005 - 1.030   pH 5.5  5.0 - 8.0   Glucose, UA NEGATIVE  NEGATIVE mg/dL   Hgb urine dipstick NEGATIVE  NEGATIVE   Bilirubin Urine NEGATIVE  NEGATIVE   Ketones, ur NEGATIVE  NEGATIVE mg/dL   Protein, ur NEGATIVE  NEGATIVE mg/dL   Urobilinogen, UA 0.2  0.0 - 1.0 mg/dL   Nitrite NEGATIVE  NEGATIVE   Leukocytes, UA NEGATIVE  NEGATIVE   Comment: MICROSCOPIC NOT DONE ON URINES WITH NEGATIVE PROTEIN, BLOOD, LEUKOCYTES, NITRITE, OR GLUCOSE <1000 mg/dL.  URINE RAPID DRUG SCREEN (HOSP PERFORMED)     Status: Abnormal   Collection Time    11/22/13  5:28 AM      Result Value Range   Opiates NONE DETECTED  NONE DETECTED   Cocaine NONE DETECTED  NONE DETECTED   Benzodiazepines POSITIVE (*) NONE DETECTED   Amphetamines NONE DETECTED  NONE DETECTED   Tetrahydrocannabinol NONE DETECTED  NONE DETECTED   Barbiturates NONE DETECTED  NONE DETECTED   Comment:            DRUG SCREEN FOR MEDICAL PURPOSES     ONLY.  IF CONFIRMATION IS NEEDED     FOR ANY PURPOSE, NOTIFY LAB     WITHIN 5 DAYS.  LOWEST DETECTABLE LIMITS     FOR URINE DRUG SCREEN     Drug Class       Cutoff (ng/mL)     Amphetamine      1000     Barbiturate      200     Benzodiazepine   179     Tricyclics       150     Opiates          300     Cocaine           300     THC              50    Current Facility-Administered Medications  Medication Dose Route Frequency Provider Last Rate Last Dose  . citalopram (CELEXA) tablet 40 mg  40 mg Oral Daily Shuvon Rankin, NP      . LORazepam (ATIVAN) tablet 0-4 mg  0-4 mg Oral Q6H Nathan R. Alvino Chapel, MD       Followed by  . [START ON 11/24/2013] LORazepam (ATIVAN) tablet 0-4 mg  0-4 mg Oral Q12H Nathan R. Alvino Chapel, MD       Current Outpatient Prescriptions  Medication Sig Dispense Refill  . citalopram (CELEXA) 40 MG tablet Take 1 tablet (40 mg total) by mouth daily. For depression/anxiety  30 tablet  0  . ibuprofen (ADVIL,MOTRIN) 200 MG tablet Take 200-400 mg by mouth every 6 (six) hours as needed.      . traZODone (DESYREL) 100 MG tablet Take 1 tablet (100 mg total) by mouth at bedtime. For sleep  30 tablet  0    Psychiatric Specialty Exam:     Blood pressure 100/67, pulse 92, temperature 98 F (36.7 C), temperature source Oral, resp. rate 18, height 5' 9" (1.753 m), weight 63.504 kg (140 lb), last menstrual period 11/18/2013, SpO2 95.00%.Body mass index is 20.67 kg/(m^2).  General Appearance: Casual  Eye Contact::  Fair  Speech:  Clear and Coherent and Normal Rate  Volume:  Normal  Mood:  Irritable  Affect:  Congruent  Thought Process:  Circumstantial  Orientation:  Full (Time, Place, and Person)  Thought Content:  "Iwant to stop drinking"  Suicidal Thoughts:  No  Homicidal Thoughts:  No  Memory:  Immediate;   Good Recent;   Good  Judgement:  Poor  Insight:  Lacking  Psychomotor Activity:  Normal  Concentration:  Fair  Recall:  Good  Akathisia:  No  Handed:  Right  AIMS (if indicated):     Assets:  Communication Skills Desire for Improvement  Sleep:      Treatment Plan Summary: Outpatient services  Disposition:  Patient to follow up with [facility] (previous meeting with on 11/21/2013).  Patient to also follow up with [facility].  Will give a list of rehab  facilities.   Discharge Assessment     Demographic Factors:  Caucasian and Low socioeconomic status  Mental Status Per Nursing Assessment::   On Admission:     Current Mental Status by Physician: Patient denies suicidal ideation, homicidal ideation, psychosis, and paranoia.  Loss Factors: NA  Historical Factors: NA  Risk Reduction Factors:   Positive coping skills or problem solving skills and Long term rehab inpatient facility  Continued Clinical Symptoms:  Alcohol/Substance Abuse/Dependencies  Cognitive Features That Contribute To Risk:  Closed-mindedness    Suicide Risk:  Minimal: No identifiable suicidal ideation.  Patients presenting with no risk factors but with morbid ruminations; may be classified as minimal  risk based on the severity of the depressive symptoms  Discharge Diagnoses:   AXIS I:  Alcohol Abuse and Substance Induced Mood Disorder AXIS II:  Deferred AXIS III:   Past Medical History  Diagnosis Date  . Depression    AXIS IV:  other psychosocial or environmental problems and problems related to social environment AXIS V:  51-60 moderate symptoms  Plan Of Care/Follow-up recommendations:  Activity:  No restrictions Diet:  Regular  Is patient on multiple antipsychotic therapies at discharge:  No   Has Patient had three or more failed trials of antipsychotic monotherapy by history:  No  Recommended Plan for Multiple Antipsychotic Therapies: NA  Rankin, Shuvon, FNP-BC 11/22/2013, 11:45 AM   11:21 AM

## 2013-11-22 NOTE — Progress Notes (Signed)
[facility] provided pt with a [employer] Orange Card application, highlighting [facility].  °

## 2013-11-22 NOTE — ED Notes (Signed)
Pt ambulated independently to nearby restroom to void.  

## 2013-11-22 NOTE — Discharge Instructions (Signed)
Finding Treatment for Alcohol and Drug Addiction It can be hard to find the right place to get professional treatment. Here are some important things to consider:  There are different types of treatment to choose from.  Some programs are live-in (residential) while others are not (outpatient). Sometimes a combination is offered.  No single type of program is right for everyone.  Most treatment programs involve a combination of education, counseling, and a 12-step, spiritually-based approach.  There are non-spiritually based programs (not 12-step).  Some treatment programs are government sponsored. They are geared for patients without private insurance.  Treatment programs can vary in many respects such as:  Cost and types of insurance accepted.  Types of on-site medical services offered.  Length of stay, setting, and size.  Overall philosophy of treatment. A person may need specialized treatment or have needs not addressed by all programs. For example, adolescents need treatment appropriate for their age. Other people have secondary disorders that must be managed as well. Secondary conditions can include mental illness, such as depression or diabetes. Often, a period of detoxification from alcohol or drugs is needed. This requires medical supervision and not all programs offer this. THINGS TO CONSIDER WHEN SELECTING A TREATMENT PROGRAM   Is the program certified by the appropriate government agency? Even private programs must be certified and employ certified professionals.  Does the program accept your insurance? If not, can a payment plan be set up?  Is the facility clean, organized, and well run? Do they allow you to speak with graduates who can share their treatment experience with you? Can you tour the facility? Can you meet with staff?  Does the program meet the full range of individual needs?  Does the treatment program address sexual orientation and physical disabilities?  Do they provide age, gender, and culturally appropriate treatment services?  Is treatment available in languages other than English?  Is long-term aftercare support or guidance encouraged and provided?  Is assessment of an individual's treatment plan ongoing to ensure it meets changing needs?  Does the program use strategies to encourage reluctant patients to remain in treatment long enough to increase the likelihood of success?  Does the program offer counseling (individual or group) and other behavioral therapies?  Does the program offer medicine as part of the treatment regimen, if needed?  Is there ongoing monitoring of possible relapse? Is there a defined relapse prevention program? Are services or referrals offered to family members to ensure they understand addiction and the recovery process? This would help them support the recovering individual.  Are 12-step meetings held at the center or is transport available for patients to attend outside meetings? In countries outside of the [place] and [place], [employer], [employer] for contact information for services in your area. Document Released: 09/30/2005 Document Revised: 01/24/2012 Document Reviewed: 04/11/2008 [facility] Patient Information 2014 [place].  Alcohol and Nutrition Nutrition serves two purposes. It provides energy. It also maintains body structure and function. Food supplies energy. It also provides the building blocks needed to replace worn or damaged cells. Alcoholics often eat poorly. This limits their supply of essential nutrients. This affects energy supply and structure maintenance. Alcohol also affects the body's nutrients in:  Digestion.  Storage.  Using and getting rid of waste products. IMPAIRMENT OF NUTRIENT DIGESTION AND UTILIZATION   Once ingested, food must be broken down into small components (digested). Then it is available for energy. It helps maintain body structure and function. Digestion begins  in the mouth.  It continues in the stomach and intestines, with help from the pancreas. The nutrients from digested food are absorbed from the intestines into the blood. Then they are carried to the liver. The liver prepares nutrients for:  Immediate use.  Storage and future use.  Alcohol inhibits the breakdown of nutrients into usable molecules.  It decreases secretion of digestive enzymes from the pancreas.  Alcohol impairs nutrient absorption by damaging the cells lining the stomach and intestines.  It also interferes with moving some nutrients into the blood.  In addition, nutritional deficiencies themselves may lead to further absorption problems.  For example, folate deficiency changes the cells that line the small intestine. This impairs how water is absorbed. It also affects absorbed nutrients. These include glucose, sodium, and additional folate.  Even if nutrients are digested and absorbed, alcohol can prevent them from being fully used. It changes their transport, storage, and excretion. Impaired utilization of nutrients by alcoholics is indicated by:  Decreased liver stores of vitamins, such as vitamin A.  Increased excretion of nutrients such as fat. ALCOHOL AND ENERGY SUPPLY   Three basic nutritional components found in food are:  Carbohydrates.  Proteins.  Fats.  These are used as energy. Some alcoholics take in as much as 50% of their total daily calories from alcohol. They often neglect important foods.  Even when enough food is eaten, alcohol can impair the ways the body controls blood sugar (glucose) levels. It may either increase or decrease blood sugar.  In non-diabetic alcoholics, increased blood sugar (hyperglycemia) is caused by poor insulin secretion. It is usually temporary.  Decreased blood sugar (hypoglycemia) can cause serious injury even if this condition is short-lived. Low blood sugar can happen when a fasting or malnourished person drinks  alcohol. When there is no food to supply energy, stored sugar is used up. The products of alcohol inhibit forming glucose from other compounds such as amino acids. As a result, alcohol causes the brain and other body tissue to lack glucose. It is needed for energy and function.  Alcohol is an energy source. But how the body processes and uses the energy from alcohol is complex. Also, when alcohol is substituted for carbohydrates, subjects tend to lose weight. This indicates that they get less energy from alcohol than from food. ALCOHOL - MAINTAINING CELL STRUCTURE AND FUNCTION  Structure Cells are made mostly of protein. So an adequate protein diet is important for maintaining cell structure. This is especially true if cells are being damaged. Research indicates that alcohol affects protein nutrition by causing impaired:  Digestion of proteins to amino acids.  Processing of amino acids by the small intestine and liver.  Synthesis of proteins from amino acids.  Protein secretion by the liver. Function Nutrients are essential for the body to function well. They provide the tools that the body needs to work well:   Proteins.  Vitamins.  Minerals. Alcohol can disrupt body function. It may cause nutrient deficiencies. And it may interfere with the way nutrients are processed. Vitamins  Vitamins are essential to maintain growth and normal metabolism. They regulate many of the body`s processes. Chronic heavy drinking causes deficiencies in many vitamins. This is caused by eating less. And, in some cases, vitamins may be poorly absorbed. For example, alcohol inhibits fat absorption. It impairs how the vitamins A, E, and D are normally absorbed along with dietary fats. Not enough vitamin A may cause night blindness. Not enough vitamin D may cause softening of the bones.  Some alcoholics lack vitamins A, C, D, E, K, and the B vitamins. These are all involved in wound healing and cell maintenance. In  particular, because vitamin K is necessary for blood clotting, lacking that vitamin can cause delayed clotting. The result is excess bleeding. Lacking other vitamins involved in brain function may cause severe neurological damage. Minerals Deficiencies of minerals such as calcium, magnesium, iron, and zinc are common in alcoholics. The alcohol itself does not seem to affect how these minerals are absorbed. Rather, they seem to occur secondary to other alcohol-related problems, such as:  Less calcium absorbed.  Not enough magnesium.  More urinary excretion.  Vomiting.  Diarrhea.  Not enough iron due to gastrointestinal bleeding.  Not enough zinc or losses related to other nutrient deficiencies.  Mineral deficiencies can cause a variety of medical consequences. These range from calcium-related bone disease to zinc-related night blindness and skin lesions. ALCOHOL, MALNUTRITION, AND MEDICAL COMPLICATIONS  Liver Disease   Alcoholic liver damage is caused primarily by alcohol itself. But poor nutrition may increase the risk of alcohol-related liver damage. For example, nutrients normally found in the liver are known to be affected by drinking alcohol. These include carotenoids, which are the major sources of vitamin A, and vitamin E compounds. Decreases in such nutrients may play some role in alcohol-related liver damage. Pancreatitis  Research suggests that malnutrition may increase the risk of developing alcoholic pancreatitis. Research suggests that a diet lacking in protein may increase alcohol's damaging effect on the pancreas. Brain  Nutritional deficiencies may have severe effects on brain function. These may be permanent. Specifically, thiamine deficiencies are often seen in alcoholics. They can cause severe neurological problems. These include:  Impaired movement.  Memory loss seen in Wernicke-Korsakoff syndrome. Pregnancy  Alcohol has toxic effects on fetal development. It  causes alcohol-related birth defects. They include fetal alcohol syndrome. Alcohol itself is toxic to the fetus. Also, the nutritional deficiency can affect how the fetus develops. That may compound the risk of developmental damage.  Nutritional needs during pregnancy are 10% to 30% greater than normal. Food intake can increase by as much as 140% to cover the needs of both mother and fetus. An alcoholic mother`s nutritional problems may adversely affect the nutrition of the fetus. And alcohol itself can also restrict nutrition flow to the fetus. NUTRITIONAL STATUS OF ALCOHOLICS  Techniques for assessing nutritional status include:  Taking body measurements to estimate fat reserves. They include:  Weight.  Height.  Mass.  Skin fold thickness.  Performing blood analysis to provide measurements of circulating:  Proteins.  Vitamins.  Minerals.  These techniques tend to be imprecise. For many nutrients, there is no clear "cut-off" point that would allow an accurate definition of deficiency. So assessing the nutritional status of alcoholics is limited by these techniques. Dietary status may provide information about the risk of developing nutritional problems. Dietary status is assessed by:  Taking patients' dietary histories.  Evaluating the amount and types of food they are eating.  It is difficult to determine what exact amount of alcohol begins to have damaging effects on nutrition. In general, moderate drinkers have 2 drinks or less per day. They seem to be at little risk for nutritional problems. Various medical disorders begin to appear at greater levels.  Research indicates that the majority of even the heaviest drinkers have few obvious nutritional deficiencies. Many alcoholics who are hospitalized for medical complications of their disease do have severe malnutrition. Alcoholics tend to eat poorly. Often they  eat less than the amounts of food necessary to provide  enough:  Carbohydrates.  Protein.  Fat.  Vitamins A and C.  B vitamins.  Minerals like calcium and iron. Of major concern is alcohol's effect on digesting food and use of nutrients. It may shift a mildly malnourished person toward severe malnutrition. Document Released: 08/26/2005 Document Revised: 01/24/2012 Document Reviewed: 02/09/2006 [facility] Patient Information 2014 [place].  Alcohol Use Disorder Alcohol use disorder is a mental disorder. It is not a one-time incident of heavy drinking. Alcohol use disorder is the excessive and uncontrollable use of alcohol over time that leads to problems with functioning in one or more areas of daily living. People with this disorder risk harming themselves and others when they drink to excess. Alcohol use disorder also can cause other mental disorders, such as mood and anxiety disorders, and serious physical problems. People with alcohol use disorder often misuse other drugs.  Alcohol use disorder is common and widespread. Some people with this disorder drink alcohol to cope with or escape from negative life events. Others drink to relieve chronic pain or symptoms of mental illness. People with a family history of alcohol use disorder are at higher risk of losing control and using alcohol to excess.  SYMPTOMS  Signs and symptoms of alcohol use disorder may include the following:   Consumption ofalcohol inlarger amounts or over a longer period of time than intended.  Multiple unsuccessful attempts to cutdown or control alcohol use.   A great deal of time spent obtaining alcohol, using alcohol, or recovering from the effects of alcohol (hangover).  A strong desire or urge to use alcohol (cravings).   Continued use of alcohol despite problems at work, school, or home because of alcohol use.   Continued use of alcohol despite problems in relationships because of alcohol use.  Continued use of alcohol in situations when it is  physically hazardous, such as driving a car.  Continued use of alcohol despite awareness of a physical or psychological problem that is likely related to alcohol use. Physical problems related to alcohol use can involve the brain, heart, liver, stomach, and intestines. Psychological problems related to alcohol use include intoxication, depression, anxiety, psychosis, delirium, and dementia.   The need for increased amounts of alcohol to achieve the same desired effect, or a decreased effect from the consumption of the same amount of alcohol (tolerance).  Withdrawal symptoms upon reducing or stopping alcohol use, or alcohol use to reduce or avoid withdrawal symptoms. Withdrawal symptoms include:  Racing heart.  Hand tremor.  Difficulty sleeping.  Nausea.  Vomiting.  Hallucinations.  Restlessness.  Seizures. DIAGNOSIS Alcohol use disorder is diagnosed through an assessment by your caregiver. Your caregiver may start by asking three or four questions to screen for excessive or problematic alcohol use. To confirm a diagnosis of alcohol use disorder, at least two symptoms (see SYMPTOMS) must be present within a 5723-month period. The severity of alcohol use disorder depends on the number of symptoms:  Mild two or three.  Moderate four or five.  Severe six or more. Your caregiver may perform a physical exam or use results from lab tests to see if you have physical problems resulting from alcohol use. Your caregiver may refer you to a mental health professional for evaluation. TREATMENT  Some people with alcohol use disorder are able to reduce their alcohol use to low-risk levels. Some people with alcohol use disorder need to quit drinking alcohol. When necessary, mental health professionals with  specialized training in substance use treatment can help. Your caregiver can help you decide how severe your alcohol use disorder is and what type of treatment you need. The following forms of  treatment are available:   Detoxification. Detoxification involves the use of prescription medication to prevent alcohol withdrawal symptoms in the first week after quitting. This is important for people with a history of symptoms of withdrawal and for heavy drinkers who are likely to have withdrawal symptoms. Alcohol withdrawal can be dangerous and, in severe cases, cause death. Detoxification is usually provided in a hospital or in-patient substance use treatment facility.  Counseling or talk therapy. Talk therapy is provided by substance use treatment counselors. It addresses the reasons people use alcohol and ways to keep them from drinking again. The goals of talk therapy are to help people with alcohol use disorder find healthy activities and ways to cope with life stress, to identify and avoid triggers for alcohol use, and to handle cravings, which can cause relapse.  Medication.Different medications can help treat alcohol use disorder through the following actions:  Decrease alcohol cravings.  Decrease the positive reward response felt from alcohol use.  Produce an uncomfortable physical reaction when alcohol is used (aversion therapy).  Support groups. Support groups are run by people who have quit drinking. They provide emotional support, advice, and guidance. These forms of treatment are often combined. Some people with alcohol use disorder benefit from intensive combination treatment provided by specialized substance use treatment centers. Both inpatient and outpatient treatment programs are available. Document Released: 12/09/2004 Document Revised: 07/04/2013 Document Reviewed: 02/08/2013 [facility] Patient Information 2014 [place], [place].  Substance Abuse Your exam indicates that you have a problem with substance abuse. Substance abuse is the misuse of alcohol or drugs that causes problems in family life, friendships, and work relationships. Substance abuse is the most important  cause of premature illness, disability, and death in our society. It is also the greatest threat to a person's mental and spiritual well being. Substance abuse can start out in an innocent way, such as social drinking or taking a little extra medication prescribed by your doctor. No one starts out with the intention of becoming an alcoholic or an addict. Substance abuse victims cannot control their use of alcohol or drugs. They may become intoxicated daily or go on weekend binges. Often there is a strong desire to quit, but attempts to stop using often fail. Encounters with law enforcement or conflicts with family members, friends, and work associates are signs of a potential problem. Recovery is always possible, although the craving for some drugs makes it difficult to quit without assistance. Many treatment programs are available to help people stop abusing alcohol or drugs. The first step in treatment is to admit you have a problem. This is a major hurdle because denial is a powerful force with substance abuse. Alcoholics Anonymous, Narcotics Anonymous, Cocaine Anonymous, and other recovery groups and programs can be very useful in helping people to quit. If you do not feel okay about your drug or alcohol use and if it is causing you trouble, we want to encourage you to talk about it with your doctor or with someone from a recovery group who can help you. You could also call the [employer] on Drug Abuse at 1-800-662-HELP. It is up to you to take the first step. [facility]ON and A[facility] are support groups for friends and family members of an alcohol or drug dependent person. The people who love and care  for the alcoholic or addicted person often need help, too. For information about these organizations, check your phone directory or call a local alcohol or drug treatment center. Document Released: 12/09/2004 Document Revised: 01/24/2012 Document Reviewed: 11/02/2008 [facility] Patient Information 2014  [place], [place].

## 2013-11-22 NOTE — ED Notes (Signed)
Pt brought in by friend; pt unable to stand; slurred speech; states wants detox; states "enough" when asked about intake; [facility]--pt can't remember when she was discharged or what she took

## 2013-11-22 NOTE — ED Notes (Signed)
Pt states she is an alcoholic and relapsed  Pt states she does not know how much she drank tonight but states it was a lot  Pt states she walked here for help  Pt's speech is slurred  Cooperative at this time  Pt is cool to the touch  Warm blankets given

## 2013-11-22 NOTE — ED Notes (Signed)
2 bags of pt belongings transferred with pt to holding area.

## 2013-11-22 NOTE — ED Provider Notes (Signed)
CSN: [id number]     Arrival date & time 11/22/13  0241 History   First MD Initiated Contact with Patient 11/22/13 0250     Chief Complaint  Patient presents with  . Alcohol Intoxication   (Consider location/radiation/quality/duration/timing/severity/associated sxs/prior Treatment) HPI Comments: Patient is a 33 year old female with history of alcoholism. [facility] and released one week ago. Sounds as though she promptly resumed drinking. She tells me she has been drinking beer all evening and presents here wanting help with her alcoholism. She denies any fall, injury, or trauma. She denies any pain.  Patient is a 33 y.o. female presenting with intoxication. The history is provided by the patient.  Alcohol Intoxication This is a chronic problem. The current episode started 3 to 5 hours ago. The problem occurs constantly. The problem has not changed since onset.Pertinent negatives include no chest pain and no abdominal pain. Nothing aggravates the symptoms. Nothing relieves the symptoms. She has tried nothing for the symptoms. The treatment provided no relief.    Past Medical History  Diagnosis Date  . Depression    History reviewed. No pertinent past surgical history. No family history on file. History  Substance Use Topics  . Smoking status: Current Every Day Smoker -- 1.00 packs/day for 10 years    Types: Cigarettes  . Smokeless tobacco: Not on file  . Alcohol Use: 22.9 oz/week    5 Glasses of wine, 24 Cans of beer, 5 Shots of liquor, 5 Drinks containing 0.5 oz of alcohol per week     Comment: 1 pint twice weekly   OB History   Grav Para Term Preterm Abortions TAB SAB Ect Mult Living                 Review of Systems  Cardiovascular: Negative for chest pain.  Gastrointestinal: Negative for abdominal pain.  All other systems reviewed and are negative.    Allergies  Review of patient's allergies indicates no known allergies.  Home  Medications   Current Outpatient Rx  Name  Route  Sig  Dispense  Refill  . citalopram (CELEXA) 40 MG tablet   Oral   Take 1 tablet (40 mg total) by mouth daily. For depression/anxiety   30 tablet   0   . traZODone (DESYREL) 100 MG tablet   Oral   Take 1 tablet (100 mg total) by mouth at bedtime. For sleep   30 tablet   0   . traZODone (DESYREL) 100 MG tablet   Oral   Take 1 tablet (100 mg total) by mouth at bedtime. For sleep   30 tablet   0    BP 112/71  Pulse 86  Temp(Src) 95.6 F (35.3 C) (Rectal)  Resp 21  Ht 5\' 9"  (1.753 m)  Wt 140 lb (63.504 kg)  BMI 20.67 kg/m2  SpO2 96%  LMP 11/18/2013 Physical Exam  Nursing note and vitals reviewed. Constitutional: She is oriented to person, place, and time. She appears well-developed and well-nourished. No distress.  Patient is a 33 year old female who appears intoxicated. There is a strong odor of alcohol present.  HENT:  Head: Normocephalic and atraumatic.  Mouth/Throat: Oropharynx is clear and moist.  Neck: Normal range of motion. Neck supple.  Cardiovascular: Normal rate and regular rhythm.  Exam reveals no gallop and no friction rub.   No murmur heard. Pulmonary/Chest: Effort normal and breath sounds normal. No respiratory distress. She has no wheezes.  Abdominal: Soft. Bowel  sounds are normal. She exhibits no distension. There is no tenderness.  Musculoskeletal: Normal range of motion.  Neurological: She is alert and oriented to person, place, and time.  Skin: Skin is warm and dry. She is not diaphoretic.    ED Course  Procedures (including critical care time) Labs Review Labs Reviewed  ETHANOL  CBC WITH DIFFERENTIAL  COMPREHENSIVE METABOLIC PANEL  URINALYSIS, ROUTINE W REFLEX MICROSCOPIC  URINE RAPID DRUG SCREEN (HOSP PERFORMED)   Imaging Review No results found.    MDM  No diagnosis found. Patient is a 33 year old female presents to the ER with complaints of alcohol intoxication and wanting help  with her alcohol addiction. She was recently admitted to [facility] for the same but has relapsed. She is obviously intoxicated and blood alcohol is 378. Tox screen is positive for benzodiazepines but is otherwise negative. She will be allowed to sober up and will discuss with TTS once the patient's blood alcohol has reached the appropriate level.    Geoffery Lyons, MD 11/22/13 [phone]

## 2013-11-26 NOTE — Progress Notes (Addendum)
Patient Discharge Instructions:  After Visit Summary (AVS):   Faxed to:  11/26/13 Discharge Summary Note:   Faxed to:  11/26/13 Psychiatric Admission Assessment Note:   Faxed to:  11/26/13 Suicide Risk Assessment - Discharge Assessment:   Faxed to:  11/26/13 Faxed/Sent to the Next Level Care provider:  11/26/13 [facility] @ [phone] [facility] to [facility] @ [phone]  Jerelene ReddenSheena E Williams, 11/26/2013, 2:57 PM

## 2017-03-10 ENCOUNTER — Other Ambulatory Visit: Payer: Self-pay | Admitting: Physician Assistant

## 2017-03-10 DIAGNOSIS — N939 Abnormal uterine and vaginal bleeding, unspecified: Secondary | ICD-10-CM

## 2017-03-17 ENCOUNTER — Ambulatory Visit
Admission: RE | Admit: 2017-03-17 | Discharge: 2017-03-17 | Disposition: A | Discharge status: Home / Self Care | Payer: Commercial Managed Care - HMO | Source: Ambulatory Visit | Attending: Physician Assistant | Admitting: Physician Assistant

## 2017-03-17 ENCOUNTER — Other Ambulatory Visit: Payer: Self-pay | Admitting: Orthopedic Surgery

## 2017-03-17 DIAGNOSIS — N939 Abnormal uterine and vaginal bleeding, unspecified: Secondary | ICD-10-CM

## 2017-03-17 DIAGNOSIS — M419 Scoliosis, unspecified: Secondary | ICD-10-CM

## 2017-03-30 ENCOUNTER — Ambulatory Visit
Admission: RE | Admit: 2017-03-30 | Discharge: 2017-03-30 | Disposition: A | Discharge status: Home / Self Care | Payer: Commercial Managed Care - HMO | Source: Ambulatory Visit | Attending: Orthopedic Surgery | Admitting: Orthopedic Surgery

## 2017-03-30 DIAGNOSIS — M419 Scoliosis, unspecified: Secondary | ICD-10-CM

## 2019-05-07 ENCOUNTER — Emergency Department (HOSPITAL_COMMUNITY): Payer: 59

## 2019-05-07 ENCOUNTER — Encounter (HOSPITAL_COMMUNITY): Payer: Self-pay | Admitting: Family Medicine

## 2019-05-07 ENCOUNTER — Other Ambulatory Visit: Payer: Self-pay

## 2019-05-07 ENCOUNTER — Emergency Department (HOSPITAL_COMMUNITY)
Admission: EM | Admit: 2019-05-07 | Discharge: 2019-05-07 | Disposition: A | Discharge status: Home / Self Care | Payer: 59 | Attending: Emergency Medicine | Admitting: Emergency Medicine

## 2019-05-07 DIAGNOSIS — Y9301 Activity, walking, marching and hiking: Secondary | ICD-10-CM | POA: Diagnosis not present

## 2019-05-07 DIAGNOSIS — F17210 Nicotine dependence, cigarettes, uncomplicated: Secondary | ICD-10-CM | POA: Diagnosis not present

## 2019-05-07 DIAGNOSIS — S0101XA Laceration without foreign body of scalp, initial encounter: Secondary | ICD-10-CM | POA: Diagnosis not present

## 2019-05-07 DIAGNOSIS — Z23 Encounter for immunization: Secondary | ICD-10-CM | POA: Insufficient documentation

## 2019-05-07 DIAGNOSIS — Y999 Unspecified external cause status: Secondary | ICD-10-CM | POA: Diagnosis not present

## 2019-05-07 DIAGNOSIS — Y92009 Unspecified place in unspecified non-institutional (private) residence as the place of occurrence of the external cause: Secondary | ICD-10-CM | POA: Insufficient documentation

## 2019-05-07 DIAGNOSIS — W01190A Fall on same level from slipping, tripping and stumbling with subsequent striking against furniture, initial encounter: Secondary | ICD-10-CM | POA: Diagnosis not present

## 2019-05-07 DIAGNOSIS — Z79899 Other long term (current) drug therapy: Secondary | ICD-10-CM | POA: Diagnosis not present

## 2019-05-07 HISTORY — DX: Alcohol abuse, uncomplicated: F10.10

## 2019-05-07 HISTORY — DX: Anxiety disorder, unspecified: F41.9

## 2019-05-07 MED ORDER — LIDOCAINE-EPINEPHRINE (PF) 2 %-1:200000 IJ SOLN
20.0000 mL | Freq: Once | INTRAMUSCULAR | Status: AC
  Administered 2019-05-07: 20 mL via INTRADERMAL
  Filled 2019-05-07: qty 20

## 2019-05-07 MED ORDER — ACETAMINOPHEN 500 MG PO TABS
1000.0000 mg | ORAL_TABLET | Freq: Once | ORAL | Status: AC
  Administered 2019-05-07: 1000 mg via ORAL
  Filled 2019-05-07: qty 2

## 2019-05-07 MED ORDER — LIDOCAINE-EPINEPHRINE 2 %-1:100000 IJ SOLN: 20.0000 mL | Freq: Once | INTRAMUSCULAR | Status: DC

## 2019-05-07 MED ORDER — HYDROCODONE-ACETAMINOPHEN 5-325 MG PO TABS
1.0000 | ORAL_TABLET | Freq: Once | ORAL | Status: AC
  Administered 2019-05-07: 1 via ORAL
  Filled 2019-05-07: qty 1

## 2019-05-07 MED ORDER — TETANUS-DIPHTH-ACELL PERTUSSIS 5-2.5-18.5 LF-MCG/0.5 IM SUSP
0.5000 mL | Freq: Once | INTRAMUSCULAR | Status: AC
  Administered 2019-05-07: 0.5 mL via INTRAMUSCULAR
  Filled 2019-05-07: qty 0.5

## 2019-05-07 MED ORDER — BACITRACIN ZINC 500 UNIT/GM EX OINT
TOPICAL_OINTMENT | Freq: Once | CUTANEOUS | Status: AC
  Administered 2019-05-07: 1 via TOPICAL
  Filled 2019-05-07: qty 0.9

## 2019-05-07 NOTE — ED Notes (Signed)
Patient transported to CT 

## 2019-05-07 NOTE — ED Provider Notes (Signed)
[facility]-EMERGENCY DEPT Provider Note   CSN: [id number] Arrival date & time: 05/07/19  1758    History   Chief Complaint Chief Complaint  Patient presents with   Fall   Laceration    HPI Christine Dickson is a 38 y.o. female has been a history of alcohol abuse, depression who presents for evaluation of head laceration after mechanical fall.  Patient reports he has a history of having her knee dislocate.  She states that she was walking and states that her knee became dislocated.  She went to grab it to try and prevent it from going sideways which caused her to fall and hit her head on a bookcase.  She denies any LOC.  She is not currently on blood thinners.  She reports laceration to her head as well as some pain to her cheek and around her eye.  She denies any blurry vision.  She has not had any numbness/weakness of her arms or legs, nausea/vomiting.     The history is provided by the patient.    Past Medical History:  Diagnosis Date   Alcohol abuse    Anxiety    Depression     Patient Active Problem List   Diagnosis Date Noted   Alcohol abuse 11/18/2013   Alcohol dependence (HCC) 07/16/2012   Substance induced mood disorder (HCC) 07/16/2012    History reviewed. No pertinent surgical history.   OB History   No obstetric history on file.      Home Medications    Prior to Admission medications   Medication Sig Start Date End Date Taking? Authorizing Provider  citalopram (CELEXA) 40 MG tablet Take 1 tablet (40 mg total) by mouth daily. For depression/anxiety 11/20/13   Armandina StammerNwoko, Agnes I, NP  ibuprofen (ADVIL,MOTRIN) 200 MG tablet Take 200-400 mg by mouth every 6 (six) hours as needed.    [provider]  traZODone (DESYREL) 100 MG tablet Take 1 tablet (100 mg total) by mouth at bedtime. For sleep 11/20/13   Rachael FeeLugo, Irving A, MD    Family History History reviewed. No pertinent family history.  Social History Social History    Tobacco Use   Smoking status: Current Every Day Smoker    Packs/day: 1.00    Years: 10.00    Pack years: 10.00    Types: Cigarettes   Smokeless tobacco: Never Used  Substance Use Topics   Alcohol use: Not Currently    Comment: Last: Jan/2015   Drug use: Yes    Types: Marijuana    Comment: Once a month      Allergies   Patient has no known allergies.   Review of Systems Review of Systems  Eyes: Negative for visual disturbance.  Gastrointestinal: Negative for abdominal pain, nausea and vomiting.  Skin: Positive for wound.  Neurological: Negative for weakness and numbness.  All other systems reviewed and are negative.    Physical Exam Updated Vital Signs BP 114/80    Pulse 80    Temp 98.2 F (36.8 C) (Oral)    Resp 16    Ht 5\' 8"  (1.727 m)    Wt 61.2 kg    LMP 05/03/2019    SpO2 97%    BMI 20.53 kg/m   Physical Exam Vitals signs and nursing note reviewed.  Constitutional:      Appearance: Normal appearance. She is well-developed.  HENT:     Head: Normocephalic and atraumatic.      Comments: 4 cm linear laceration with jagged  edges noted to the right forehead just at the hairline.  Tenderness palpation to anterior right scalp. Eyes:     General: Lids are normal.     Conjunctiva/sclera: Conjunctivae normal.     Pupils: Pupils are equal, round, and reactive to light.     Comments: EOMs intact without any difficulties. PERRL.  No tenderness palpation noted to periorbital region bilaterally.   Neck:     Musculoskeletal: Full passive range of motion without pain.  Cardiovascular:     Rate and Rhythm: Normal rate and regular rhythm.     Pulses: Normal pulses.     Heart sounds: Normal heart sounds. No murmur. No friction rub. No gallop.   Pulmonary:     Effort: Pulmonary effort is normal.     Breath sounds: Normal breath sounds.  Abdominal:     Palpations: Abdomen is soft. Abdomen is not rigid.     Tenderness: There is no abdominal tenderness. There is no  guarding.  Musculoskeletal: Normal range of motion.  Skin:    General: Skin is warm and dry.     Capillary Refill: Capillary refill takes less than 2 seconds.  Neurological:     Mental Status: She is alert and oriented to person, place, and time.     Comments: Cranial nerves III-XII intact Follows commands, Moves all extremities  5/5 strength to BUE and BLE  Sensation intact throughout all major nerve distributions No pronator drift. No gait abnormalities  No slurred speech. No facial droop.   Psychiatric:        Speech: Speech normal.      ED Treatments / Results  Labs (all labs ordered are listed, but only abnormal results are displayed) Labs Reviewed - No data to display  EKG None  Radiology Ct Head Wo Contrast  Result Date: 05/07/2019 CLINICAL DATA:  [age] year old female with head trauma. EXAM: CT HEAD WITHOUT CONTRAST CT MAXILLOFACIAL WITHOUT CONTRAST TECHNIQUE: Multidetector CT imaging of the head and maxillofacial structures were performed using the standard protocol without intravenous contrast. Multiplanar CT image reconstructions of the maxillofacial structures were also generated. COMPARISON:  None. FINDINGS: CT HEAD FINDINGS Brain: The ventricles and sulci appropriate size for patient's age. The gray-white matter discrimination is preserved. There is no acute intracranial hemorrhage. No mass effect or midline shift. No extra-axial fluid collection. Vascular: No hyperdense vessel or unexpected calcification. Skull: Normal. Negative for fracture or focal lesion. Other: Laceration over the right forehead. CT MAXILLOFACIAL FINDINGS Osseous: No fracture or mandibular dislocation. No destructive process. Orbits: Negative. No traumatic or inflammatory finding. Sinuses: Clear. Soft tissues: Negative. IMPRESSION: 1. No acute intracranial pathology. 2. No acute/traumatic facial bone fractures. Electronically Signed   By: Elgie CollardArash  Radparvar M.D.   On: 05/07/2019 20:08   Ct Maxillofacial  Wo Contrast  Result Date: 05/07/2019 CLINICAL DATA:  [age] year old female with head trauma. EXAM: CT HEAD WITHOUT CONTRAST CT MAXILLOFACIAL WITHOUT CONTRAST TECHNIQUE: Multidetector CT imaging of the head and maxillofacial structures were performed using the standard protocol without intravenous contrast. Multiplanar CT image reconstructions of the maxillofacial structures were also generated. COMPARISON:  None. FINDINGS: CT HEAD FINDINGS Brain: The ventricles and sulci appropriate size for patient's age. The gray-white matter discrimination is preserved. There is no acute intracranial hemorrhage. No mass effect or midline shift. No extra-axial fluid collection. Vascular: No hyperdense vessel or unexpected calcification. Skull: Normal. Negative for fracture or focal lesion. Other: Laceration over the right forehead. CT MAXILLOFACIAL FINDINGS Osseous: No fracture or mandibular dislocation. No destructive process.  Orbits: Negative. No traumatic or inflammatory finding. Sinuses: Clear. Soft tissues: Negative. IMPRESSION: 1. No acute intracranial pathology. 2. No acute/traumatic facial bone fractures. Electronically Signed   By: Anner Crete M.D.   On: 05/07/2019 20:08    Procedures .Marland KitchenLaceration Repair  Date/Time: 05/07/2019 9:33 PM Performed by: Volanda Napoleon, PA-C Authorized by: Volanda Napoleon, PA-C   Consent:    Consent obtained:  Verbal   Consent given by:  Patient   Risks discussed:  Infection, need for additional repair, pain, poor cosmetic result and poor wound healing   Alternatives discussed:  No treatment and delayed treatment Universal protocol:    Procedure explained and questions answered to patient or proxy's satisfaction: yes     Relevant documents present and verified: yes     Test results available and properly labeled: yes     Imaging studies available: yes     Required blood products, implants, devices, and special equipment available: yes     Site/side marked: yes      Immediately prior to procedure, a time out was called: yes     Patient identity confirmed:  Verbally with patient Anesthesia (see MAR for exact dosages):    Anesthesia method:  Local infiltration Laceration details:    Location:  Scalp   Scalp location:  Frontal   Length (cm):  4 Repair type:    Repair type:  Intermediate Exploration:    Hemostasis achieved with:  Direct pressure   Wound exploration: wound explored through full range of motion     Wound extent: no foreign bodies/material noted   Treatment:    Area cleansed with:  Betadine   Amount of cleaning:  Extensive   Irrigation solution:  Sterile saline   Irrigation method:  Syringe   Visualized foreign bodies/material removed: no   Skin repair:    Repair method:  Sutures   Suture size:  5-0   Suture material:  Prolene   Suture technique:  Simple interrupted   Number of sutures:  9 Approximation:    Approximation:  Close Post-procedure details:    Dressing:  Antibiotic ointment   Patient tolerance of procedure:  Tolerated well, no immediate complications Comments:     Once the wound was anesthetized, sterilely extensively irrigated with sterile saline.  No evidence of foreign bodies noted.   (including critical care time)  Medications Ordered in ED Medications  Tdap (BOOSTRIX) injection 0.5 mL (0.5 mLs Intramuscular Given 05/07/19 1936)  acetaminophen (TYLENOL) tablet 1,000 mg (1,000 mg Oral Given 05/07/19 2033)  lidocaine-EPINEPHrine (XYLOCAINE W/EPI) 2 %-1:200000 (PF) injection 20 mL (20 mLs Intradermal Given 05/07/19 2033)  bacitracin ointment (1 application Topical Given 05/07/19 2139)  HYDROcodone-acetaminophen (NORCO/VICODIN) 5-325 MG per tablet 1 tablet (1 tablet Oral Given 05/07/19 2140)   10 suture ws  Initial Impression / Assessment and Plan / ED Course  I have reviewed the triage vital signs and the nursing notes.  Pertinent labs & imaging results that were available during my care of the patient were  reviewed by me and considered in my medical decision making (see chart for details).        38 year old female who presents for evaluation of head laceration that occurred just prior to arrival.  Patient reports that she has a history of having her knee dislocated.  She was walking when she felt like her knee was getting ready to pop.  She tried to bend down to stop it and reports falling and landing into a corner of  a Child psychotherapistdresser.  No preceding chest pain or dizziness.  No LOC.  She is not currently on blood thinners.  Reports pain to the area of laceration as well as some pain to the zygomatic arch and orbital region of the right side.  No vision changes, numbness/weakness, nausea/vomiting. Patient is afebrile, non-toxic appearing, sitting comfortably on examination table. Vital signs reviewed and stable.  On exam, she has a 4 cm laceration noted to her right frontal scalp just at the hairline.  She also has tenderness palpation of the zygomatic arch.  EOMs intact with any difficulty.  Will obtain imaging to rule out any fracture.  Patient reports that her tetanus is not up-to-date.  Plan for wound care, tetanus update.  CT head shows no acute intracranial pathology.  CT maxillofacial shows no evidence of orbital fracture, facial fracture.  Laceration repaired as documented above.  Patient tolerated procedure well.  Encourage at home supportive care measures.  Discussed with patient regarding wound care. At this time, patient exhibits no emergent life-threatening condition that require further evaluation in ED or admission. Patient had ample opportunity for questions and discussion. All patient's questions were answered with full understanding. Strict return precautions discussed. Patient expresses understanding and agreement to plan.   Portions of this note were generated with Scientist, clinical (histocompatibility and immunogenetics)Dragon dictation software. Dictation errors may occur despite best attempts at proofreading.    Final Clinical Impressions(s)  / ED Diagnoses   Final diagnoses:  Laceration of scalp, initial encounter    ED Discharge Orders    None       Rosana HoesLayden, Naquan Garman A, PA-C 05/08/19 0050    Linwood DibblesKnapp, Jon, MD 05/09/19 2022

## 2019-05-07 NOTE — ED Triage Notes (Signed)
Patient states she was standing up, going to turn around, when her right knee became dislocated. Patient states she fell, hit her head on a bookcase, and has a laceration on her forehead. Denies any LOC. Bleeding controlled with pressure. Patient right knee is back into place and she is able to walk steadily.

## 2019-05-07 NOTE — Discharge Instructions (Signed)
Keep the wound clean and dry for the first 24 hours. After that you may gently clean the wound with soap and water. Make sure to pat dry the wound before covering it with any dressing. You can use topical antibiotic ointment and bandage. Ice and elevate for pain relief.  ° °You can take Tylenol or Ibuprofen as directed for pain. You can alternate Tylenol and Ibuprofen every 4 hours for additional pain relief.  ° °Return to the Emergency Department, your primary care doctor, or the [facility] in 5-7 days for suture removal.  ° °Monitor closely for any signs of infection. Return to the Emergency Department for any worsening redness/swelling of the area that begins to spread, drainage from the site, worsening pain, fever or any other worsening or concerning symptoms.  ° ° °

## 2021-09-14 ENCOUNTER — Other Ambulatory Visit: Payer: Self-pay | Admitting: Physician Assistant

## 2021-09-14 DIAGNOSIS — Z1231 Encounter for screening mammogram for malignant neoplasm of breast: Secondary | ICD-10-CM

## 2021-09-17 ENCOUNTER — Other Ambulatory Visit: Payer: Self-pay | Admitting: Physician Assistant

## 2021-09-17 DIAGNOSIS — Z1231 Encounter for screening mammogram for malignant neoplasm of breast: Secondary | ICD-10-CM

## 2021-10-19 ENCOUNTER — Ambulatory Visit
Admission: RE | Admit: 2021-10-19 | Discharge: 2021-10-19 | Disposition: A | Discharge status: Home / Self Care | Payer: Self-pay | Source: Ambulatory Visit | Attending: Physician Assistant | Admitting: Physician Assistant

## 2021-10-19 DIAGNOSIS — Z1231 Encounter for screening mammogram for malignant neoplasm of breast: Secondary | ICD-10-CM

## 2022-01-02 IMAGING — MG MM DIGITAL SCREENING BILAT W/ TOMO AND CAD
8 series · 9 of 24 positions shown · non-contrast
Comparison: None.

CLINICAL DATA: Screening.

EXAM:
DIGITAL SCREENING BILATERAL MAMMOGRAM WITH TOMOSYNTHESIS AND CAD
TECHNIQUE: Bilateral screening digital craniocaudal and mediolateral oblique
mammograms were obtained. Bilateral screening digital breast
tomosynthesis was performed. The images were evaluated with
computer-aided detection.

[R MLO synth-2D]
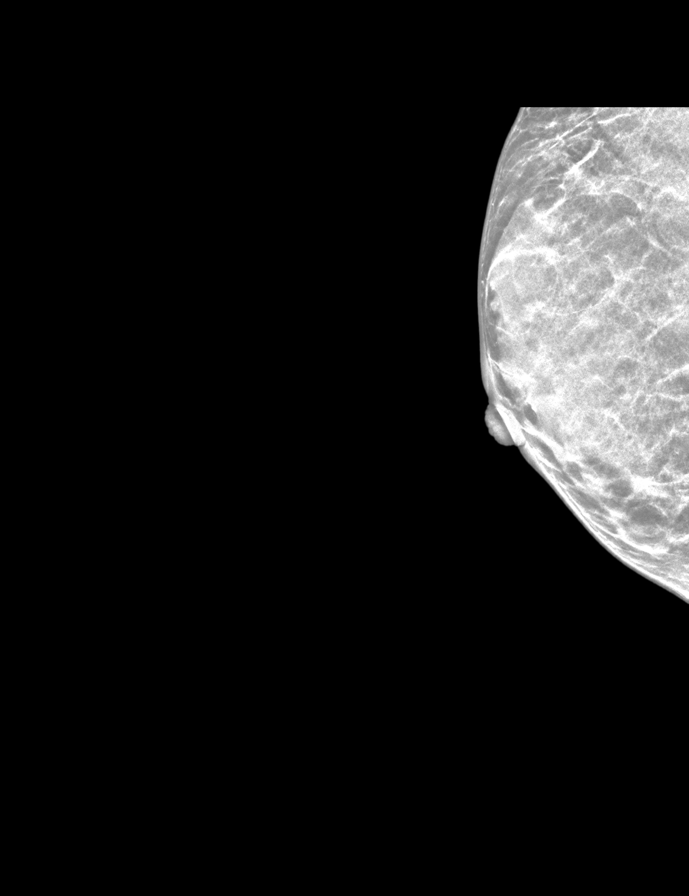

[L CC synth-2D]
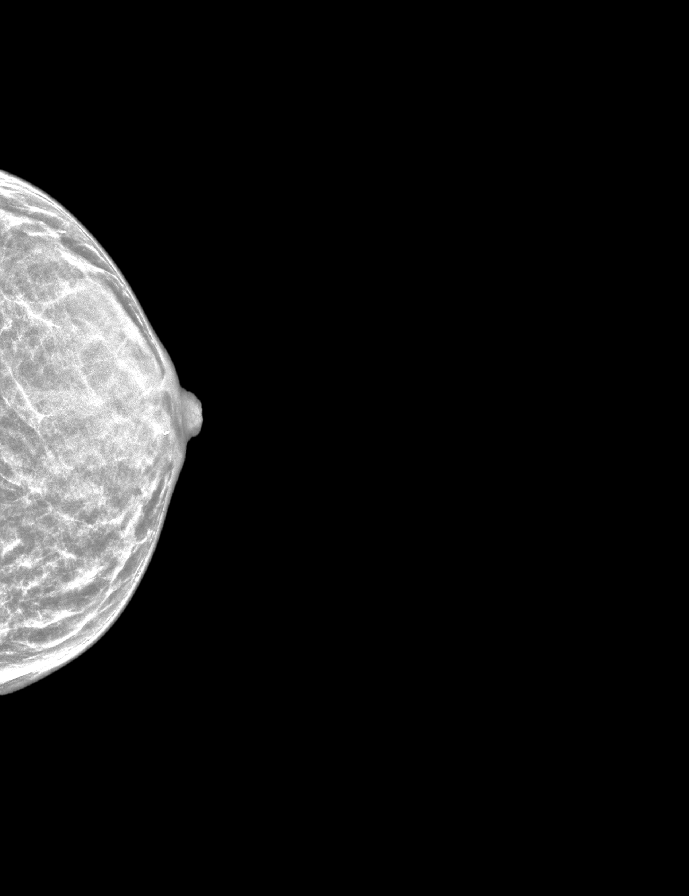

[L MLO synth-2D]
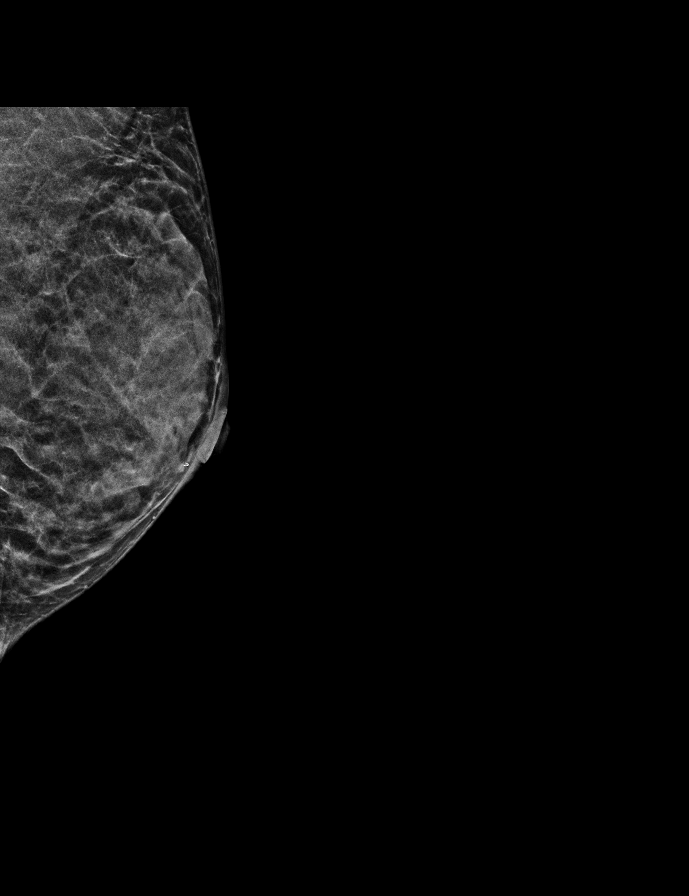

[R CC synth-2D]
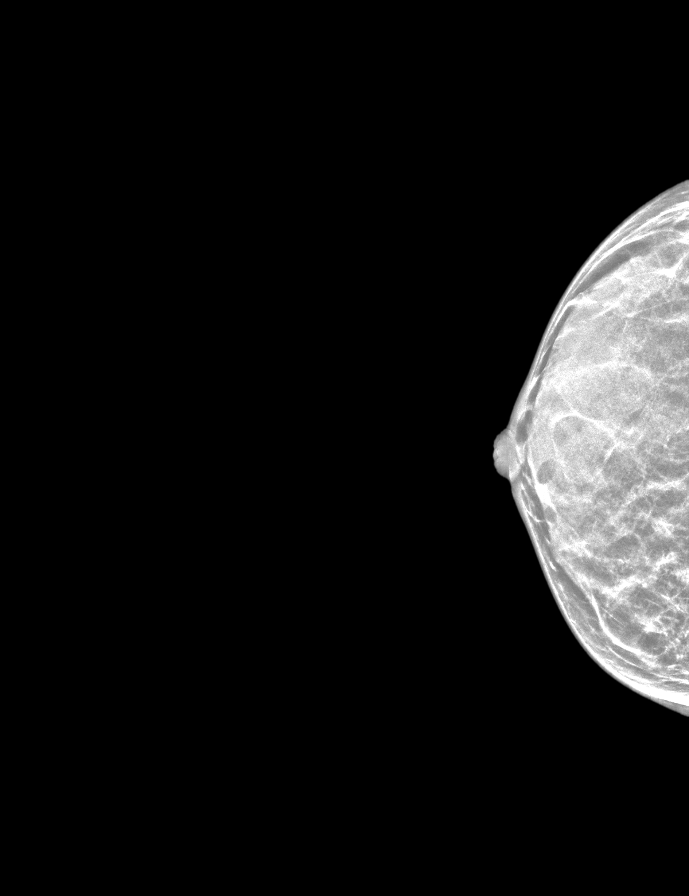

[L CC tomo · 2 of 34 frames shown]
[frame 12/34]
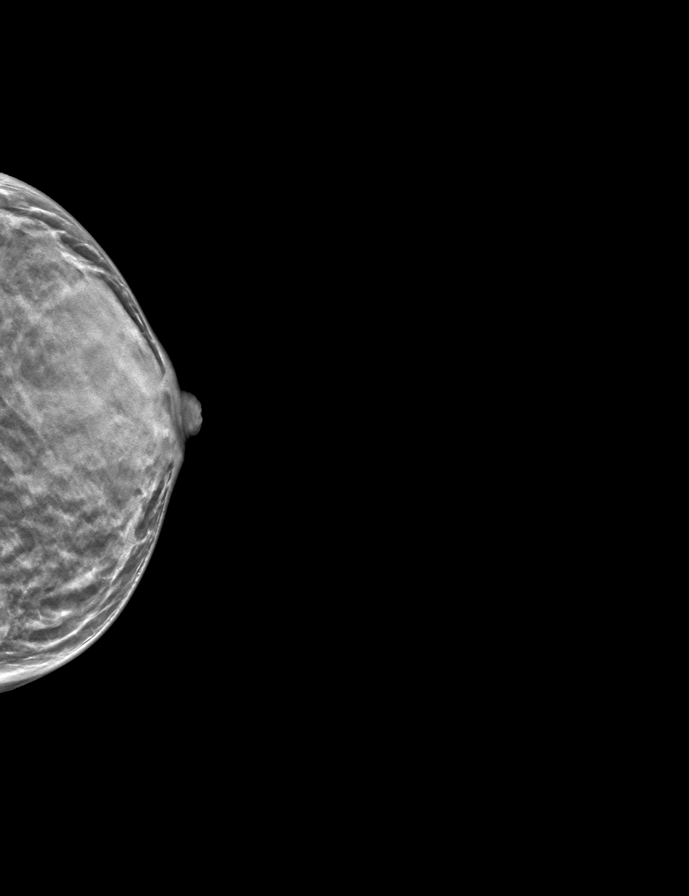
[frame 17/34]
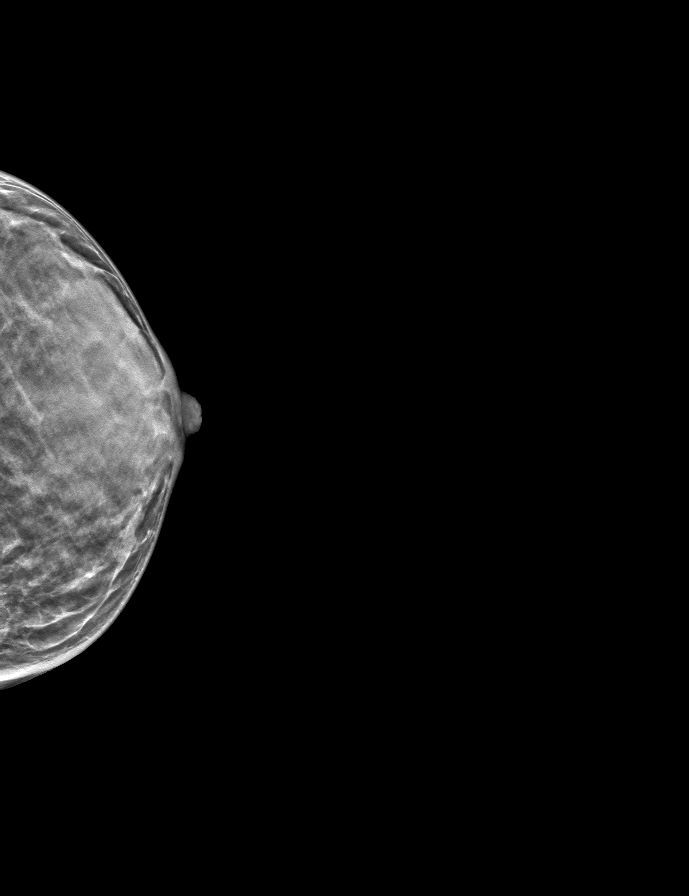

[R CC tomo · tomo slice 17/33.0]
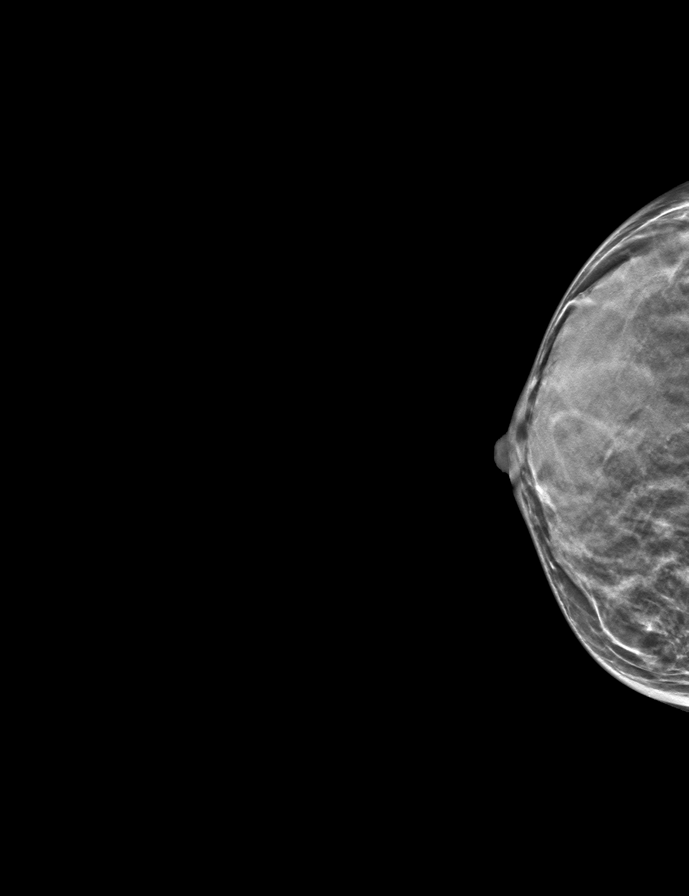

[L MLO tomo · tomo slice 17/34.0]
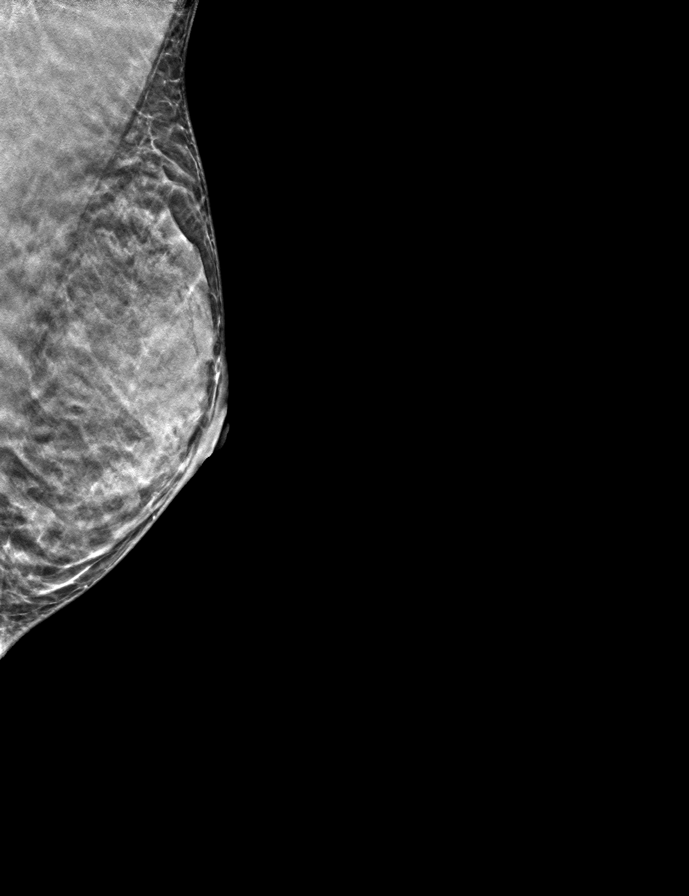

[R MLO tomo · tomo slice 17/32.0]
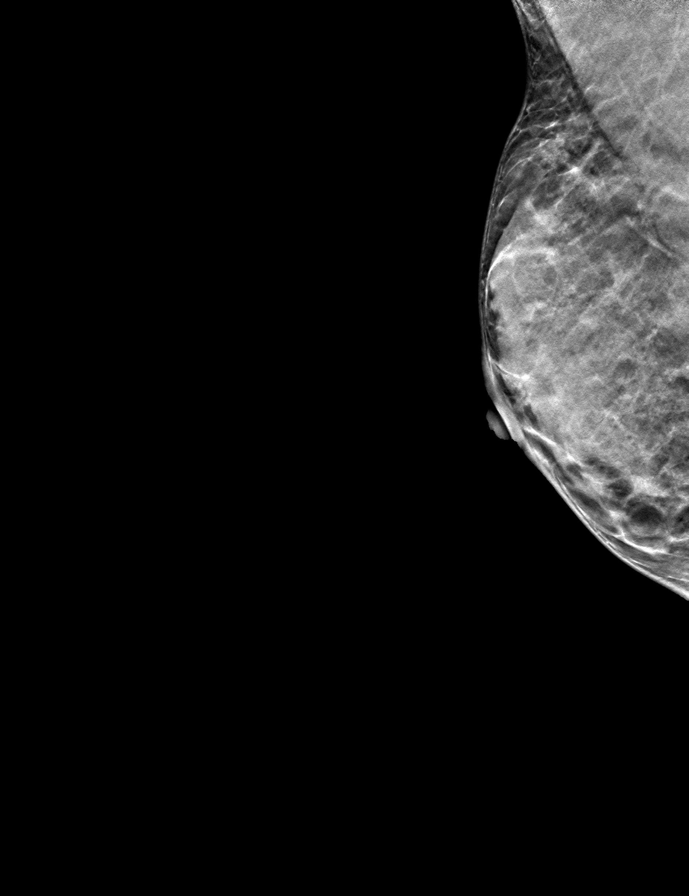

[9 of 24 positions shown; findings below may reference images not displayed]

ACR Breast Density Category d: The breast tissue is extremely dense,
which lowers the sensitivity of mammography.
FINDINGS: There are no findings suspicious for malignancy.
IMPRESSION: No mammographic evidence of malignancy. A result letter of this
screening mammogram will be mailed directly to the patient.

RECOMMENDATION:
Screening mammogram in one year. (Code:W3-Z-XIN)

BI-RADS CATEGORY  1: Negative.
